# Patient Record
Sex: Female | Born: 1960 | Race: White | Hispanic: No | Marital: Married | Smoking: Former smoker
Health system: Southern US, Community
[De-identification: ages and names within clinical notes are randomized; demographics above are authoritative.]

## PROBLEM LIST (undated history)

## (undated) DIAGNOSIS — N200 Calculus of kidney: Secondary | ICD-10-CM

## (undated) DIAGNOSIS — E7800 Pure hypercholesterolemia, unspecified: Secondary | ICD-10-CM

## (undated) DIAGNOSIS — M5030 Other cervical disc degeneration, unspecified cervical region: Secondary | ICD-10-CM

## (undated) DIAGNOSIS — I2510 Atherosclerotic heart disease of native coronary artery without angina pectoris: Secondary | ICD-10-CM

## (undated) DIAGNOSIS — I219 Acute myocardial infarction, unspecified: Secondary | ICD-10-CM

## (undated) DIAGNOSIS — F419 Anxiety disorder, unspecified: Secondary | ICD-10-CM

## (undated) DIAGNOSIS — K219 Gastro-esophageal reflux disease without esophagitis: Secondary | ICD-10-CM

## (undated) DIAGNOSIS — Q438 Other specified congenital malformations of intestine: Secondary | ICD-10-CM

## (undated) DIAGNOSIS — K5790 Diverticulosis of intestine, part unspecified, without perforation or abscess without bleeding: Secondary | ICD-10-CM

## (undated) DIAGNOSIS — K449 Diaphragmatic hernia without obstruction or gangrene: Secondary | ICD-10-CM

## (undated) DIAGNOSIS — I10 Essential (primary) hypertension: Secondary | ICD-10-CM

## (undated) DIAGNOSIS — Z8619 Personal history of other infectious and parasitic diseases: Secondary | ICD-10-CM

## (undated) HISTORY — DX: Other specified congenital malformations of intestine: Q43.8

## (undated) HISTORY — DX: Atherosclerotic heart disease of native coronary artery without angina pectoris: I25.10

## (undated) HISTORY — PX: RETINAL DETACHMENT SURGERY: SHX105

## (undated) HISTORY — DX: Anxiety disorder, unspecified: F41.9

## (undated) HISTORY — DX: Diverticulosis of intestine, part unspecified, without perforation or abscess without bleeding: K57.90

## (undated) HISTORY — DX: Personal history of other infectious and parasitic diseases: Z86.19

## (undated) HISTORY — PX: COLONOSCOPY: SHX174

## (undated) HISTORY — PX: ESOPHAGOGASTRODUODENOSCOPY: SHX1529

## (undated) HISTORY — PX: ABDOMINAL HYSTERECTOMY: SHX81

## (undated) HISTORY — DX: Other cervical disc degeneration, unspecified cervical region: M50.30

## (undated) HISTORY — DX: Diaphragmatic hernia without obstruction or gangrene: K44.9

## (undated) HISTORY — PX: CHOLECYSTECTOMY: SHX55

## (undated) HISTORY — PX: OTHER SURGICAL HISTORY: SHX169

## (undated) HISTORY — DX: Calculus of kidney: N20.0

## (undated) HISTORY — DX: Acute myocardial infarction, unspecified: I21.9

---

## 2005-05-20 DIAGNOSIS — K2270 Barrett's esophagus without dysplasia: Secondary | ICD-10-CM

## 2005-05-20 HISTORY — DX: Barrett's esophagus without dysplasia: K22.70

## 2009-11-04 ENCOUNTER — Emergency Department (HOSPITAL_COMMUNITY): Admission: EM | Admit: 2009-11-04 | Discharge: 2009-11-04 | Payer: Self-pay | Admitting: Emergency Medicine

## 2018-07-05 ENCOUNTER — Encounter (HOSPITAL_COMMUNITY): Payer: Self-pay | Admitting: Emergency Medicine

## 2018-07-05 ENCOUNTER — Emergency Department (HOSPITAL_COMMUNITY)
Admission: EM | Admit: 2018-07-05 | Discharge: 2018-07-05 | Disposition: A | Discharge status: Home / Self Care | Payer: Federal, State, Local not specified - PPO | Attending: Emergency Medicine | Admitting: Emergency Medicine

## 2018-07-05 ENCOUNTER — Other Ambulatory Visit: Payer: Self-pay

## 2018-07-05 DIAGNOSIS — N390 Urinary tract infection, site not specified: Secondary | ICD-10-CM

## 2018-07-05 DIAGNOSIS — R300 Dysuria: Secondary | ICD-10-CM | POA: Diagnosis present

## 2018-07-05 DIAGNOSIS — I10 Essential (primary) hypertension: Secondary | ICD-10-CM | POA: Diagnosis not present

## 2018-07-05 HISTORY — DX: Essential (primary) hypertension: I10

## 2018-07-05 HISTORY — DX: Gastro-esophageal reflux disease without esophagitis: K21.9

## 2018-07-05 HISTORY — DX: Pure hypercholesterolemia, unspecified: E78.00

## 2018-07-05 LAB — CBC
HCT: 41.4 % (ref 36.0–46.0)
Hemoglobin: 13.6 g/dL (ref 12.0–15.0)
MCH: 28.6 pg (ref 26.0–34.0)
MCHC: 32.9 g/dL (ref 30.0–36.0)
MCV: 87 fL (ref 80.0–100.0)
Platelets: 277 10*3/uL (ref 150–400)
RBC: 4.76 MIL/uL (ref 3.87–5.11)
RDW: 13.2 % (ref 11.5–15.5)
WBC: 11.9 10*3/uL — ABNORMAL HIGH (ref 4.0–10.5)
nRBC: 0 % (ref 0.0–0.2)

## 2018-07-05 LAB — BASIC METABOLIC PANEL
Anion gap: 10 (ref 5–15)
BUN: 12 mg/dL (ref 6–20)
CO2: 22 mmol/L (ref 22–32)
Calcium: 9.3 mg/dL (ref 8.9–10.3)
Chloride: 102 mmol/L (ref 98–111)
Creatinine, Ser: 0.86 mg/dL (ref 0.44–1.00)
GFR calc Af Amer: >60 mL/min (ref >60)
GFR calc non Af Amer: >60 mL/min (ref >60)
Glucose, Bld: 101 mg/dL — ABNORMAL HIGH (ref 70–99)
Potassium: 3.5 mmol/L (ref 3.5–5.1)
Sodium: 134 mmol/L — ABNORMAL LOW (ref 135–145)

## 2018-07-05 LAB — URINALYSIS, ROUTINE W REFLEX MICROSCOPIC
Bilirubin Urine: NEGATIVE
GLUCOSE, UA: NEGATIVE mg/dL
Ketones, ur: NEGATIVE mg/dL
NITRITE: POSITIVE — AB
Protein, ur: NEGATIVE mg/dL
SPECIFIC GRAVITY, URINE: 1.011 (ref 1.005–1.030)
WBC, UA: >50 WBC/hpf — ABNORMAL HIGH (ref 0–5)
pH: 7 (ref 5.0–8.0)

## 2018-07-05 MED ORDER — ONDANSETRON HCL 4 MG PO TABS
4.0000 mg | ORAL_TABLET | Freq: Once | ORAL | Status: AC
  Administered 2018-07-05: 4 mg via ORAL
  Filled 2018-07-05: qty 1

## 2018-07-05 MED ORDER — CEPHALEXIN 500 MG PO CAPS: 500.0000 mg | ORAL_CAPSULE | Freq: Four times a day (QID) | ORAL | 0 refills | Status: DC

## 2018-07-05 MED ORDER — PHENAZOPYRIDINE HCL 100 MG PO TABS
100.0000 mg | ORAL_TABLET | Freq: Once | ORAL | Status: AC
  Administered 2018-07-05: 100 mg via ORAL
  Filled 2018-07-05: qty 1

## 2018-07-05 MED ORDER — FLUCONAZOLE 150 MG PO TABS: 150.0000 mg | ORAL_TABLET | Freq: Every day | ORAL | 1 refills | Status: DC

## 2018-07-05 MED ORDER — CEPHALEXIN 500 MG PO CAPS
500.0000 mg | ORAL_CAPSULE | Freq: Once | ORAL | Status: AC
  Administered 2018-07-05: 500 mg via ORAL
  Filled 2018-07-05: qty 1

## 2018-07-05 NOTE — Discharge Instructions (Addendum)
Your examination is consistent with a urinary tract infection.  Your urinalysis also confirms a urinary tract infection.  A culture has been sent to the lab to confirm that the antibiotics being used will be effective.  Your kidney function is well within normal limits.  Please increase fluids.  You may continue the Azo/Pyridium.  Use Tylenol every 4 hours for fever, or soreness.  Please see your primary physician or return to the emergency department if any changes in your condition, or signs of advancing infection.

## 2018-07-05 NOTE — ED Provider Notes (Signed)
[facility] EMERGENCY DEPARTMENT Provider Note   CSN: [id number] Arrival date & time: 07/05/18  1736     History   Chief Complaint Chief Complaint  Patient presents with  . Urinary Tract Infection    HPI Lauren Mcneil is a 58 y.o. female.  The history is provided by the patient.  Urinary Tract Infection  Pain quality:  Burning Pain severity:  Moderate Onset quality:  Gradual Duration:  1 week Timing:  Intermittent Progression:  Worsening Chronicity:  New Relieved by:  Nothing Worsened by:  Nothing Ineffective treatments:  Phenazopyridine Urinary symptoms: frequent urination   Urinary symptoms: no hematuria   Associated symptoms: fever   Associated symptoms: no abdominal pain, no nausea and no vomiting   Associated symptoms comment:  Pelvic pain and pressure Back pain   Past Medical History:  Diagnosis Date  . GERD (gastroesophageal reflux disease)   . Hypercholesteremia   . Hypertension     There are no active problems to display for this patient.   Past Surgical History:  Procedure Laterality Date  . ABDOMINAL HYSTERECTOMY    . VAGINAL DELIVERY       OB History   No obstetric history on file.      Home Medications    Prior to Admission medications   Not on File    Family History No family history on file.  Social History Social History   Tobacco Use  . Smoking status: Never Smoker  . Smokeless tobacco: Never Used  Substance Use Topics  . Alcohol use: Never    Frequency: Never  . Drug use: Never     Allergies   Sulfa antibiotics   Review of Systems Review of Systems  Constitutional: Positive for fever. Negative for activity change.       All ROS Neg except as noted in HPI  HENT: Negative for nosebleeds.   Eyes: Negative for photophobia and discharge.  Respiratory: Negative for cough, shortness of breath and wheezing.   Cardiovascular: Negative for chest pain and palpitations.  Gastrointestinal: Negative for abdominal pain,  blood in stool, nausea and vomiting.  Genitourinary: Positive for dysuria and urgency. Negative for frequency and hematuria.  Musculoskeletal: Negative for arthralgias, back pain and neck pain.  Skin: Negative.   Neurological: Negative for dizziness, seizures and speech difficulty.  Psychiatric/Behavioral: Negative for confusion and hallucinations.     Physical Exam Updated Vital Signs BP (!) 152/88 (BP Location: Right Arm)   Pulse 100   Temp 99.6 F (37.6 C) (Oral)   Resp 18   Ht 5' (1.524 m)   Wt 74.8 kg   SpO2 98%   BMI 32.22 kg/m   Physical Exam Vitals signs and nursing note reviewed.  Constitutional:      Appearance: She is well-developed. She is not toxic-appearing.  HENT:     Head: Normocephalic.     Right Ear: Tympanic membrane and external ear normal.     Left Ear: Tympanic membrane and external ear normal.  Eyes:     General: Lids are normal.     Pupils: Pupils are equal, round, and reactive to light.  Neck:     Musculoskeletal: Normal range of motion and neck supple.     Vascular: No carotid bruit.  Cardiovascular:     Rate and Rhythm: Normal rate and regular rhythm.     Pulses: Normal pulses.     Heart sounds: Normal heart sounds.  Pulmonary:     Effort: No respiratory distress.  Breath sounds: Normal breath sounds.  Abdominal:     General: Bowel sounds are normal.     Palpations: Abdomen is soft.     Tenderness: There is abdominal tenderness in the suprapubic area. There is left CVA tenderness. There is no guarding.  Musculoskeletal: Normal range of motion.  Lymphadenopathy:     Head:     Right side of head: No submandibular adenopathy.     Left side of head: No submandibular adenopathy.     Cervical: No cervical adenopathy.  Skin:    General: Skin is warm and dry.  Neurological:     Mental Status: She is alert and oriented to person, place, and time.     Cranial Nerves: No cranial nerve deficit.     Sensory: No sensory deficit.  Psychiatric:         Speech: Speech normal.      ED Treatments / Results  Labs (all labs ordered are listed, but only abnormal results are displayed) Labs Reviewed  URINE CULTURE  URINALYSIS, ROUTINE W REFLEX MICROSCOPIC  BASIC METABOLIC PANEL  CBC    EKG None  Radiology No results found.  Procedures Procedures (including critical care time)  Medications Ordered in ED Medications - No data to display   Initial Impression / Assessment and Plan / ED Course  I have reviewed the triage vital signs and the nursing notes.  Pertinent labs & imaging results that were available during my care of the patient were reviewed by me and considered in my medical decision making (see chart for details).       Final Clinical Impressions(s) / ED Diagnoses MDM  Vital signs reviewed.  Pulse oximetry is 98% on room air.  Within normal limits by my interpretation.  The urine analysis shows positive nitrates, as well as moderate leukocyte esterase.  There is greater than 50 white blood cells and many bacteria present. Basic metabolic panel is within normal limits.  Complete blood count shows the white blood cells to be slightly elevated at 11.9, there is no shift to the left.  A culture of the urine has been sent to the lab.  Patient will be treated with Keflex.  Patient states she is also using Azo to help with discomfort.  I have asked her to use Tylenol every 4 hours if needed for temperature elevation, and/or aching.  The urine analysis shows yeast present.  The patient is also treated with Diflucan.  Patient will see the primary physician or return to the emergency department if any changes in condition, problems, or concerns.   Final diagnoses:  Lower urinary tract infectious disease    ED Discharge Orders         Ordered    cephALEXin (KEFLEX) 500 MG capsule  4 times daily     07/05/18 1944    fluconazole (DIFLUCAN) 150 MG tablet  Daily     07/05/18 1950        Emergency department  getting I do not know if I have been evacuated I do you certainly welcome to hold on just yesterday   Ivery Quale, Cordelia Poche 07/05/18 1958    Mesner, Barbara Cower, MD 07/05/18 2038

## 2018-07-05 NOTE — ED Triage Notes (Signed)
Pt c/o of burning with urination, frequency in urination, and pelvic pain and pressure

## 2018-07-08 LAB — URINE CULTURE: Culture: >=100000 — AB

## 2018-07-09 ENCOUNTER — Telehealth: Payer: Self-pay | Admitting: *Deleted

## 2018-07-09 ENCOUNTER — Telehealth: Payer: Self-pay

## 2018-07-09 ENCOUNTER — Telehealth (HOSPITAL_COMMUNITY): Payer: Self-pay | Admitting: Pharmacist

## 2018-07-09 NOTE — Telephone Encounter (Signed)
Post ED Visit - Positive Culture Follow-up: Successful Patient Follow-Up  Culture assessed and recommendations reviewed by:  []  Enzo Bi, Pharm.D. []  Celedonio Miyamoto, Pharm.D., BCPS AQ-ID []  Garvin Fila, Pharm.D., BCPS []  Georgina Pillion, Pharm.D., BCPS []  [place], [address].D., BCPS, AAHIVP []  Estella Husk, Pharm.D., BCPS, AAHIVP []  Lysle Pearl, PharmD, BCPS []  Phillips Climes, PharmD, BCPS [x]  Agapito Games, PharmD, BCPS []  Verlan Friends, PharmD  Positive urine culture  []  Patient discharged without antimicrobial prescription and treatment is now indicated [x]  Organism is resistant to prescribed ED discharge antimicrobial []  Patient with positive blood cultures  Changes discussed with ED provider Eyvonne Mechanic, PA New antibiotic prescription Nitrofurantoin 100mg  PO BID x 5 days Called to [facility], [place] [phone]  Contacted patient, date 07/09/2018, time 1513   Lysle Pearl 07/09/2018, 3:12 PM

## 2018-07-09 NOTE — Progress Notes (Signed)
ED Antimicrobial Stewardship Positive Culture Follow Up   Lauren Mcneil is an 58 y.o. female who presented to [facility] on (Not on file) with a chief complaint of No chief complaint on file.   Recent Results (from the past 720 hour(s))  Urine C&S     Status: Abnormal   Collection Time: 07/05/18  6:45 PM  Result Value Ref Range Status   Specimen Description   Final    URINE, CLEAN CATCH Performed at [facility], [address] [place], [place] [address]    Special Requests   Final    NONE Performed at [facility], [address], [place], [place] [address]    Culture >=100,000 COLONIES/mL ESCHERICHIA COLI (A)  Final   Report Status 07/08/2018 FINAL  Final   Organism ID, Bacteria ESCHERICHIA COLI (A)  Final      Susceptibility   Escherichia coli - MIC*    AMPICILLIN >=32 RESISTANT Resistant     CEFAZOLIN >=64 RESISTANT Resistant     CEFTRIAXONE <=1 SENSITIVE Sensitive     CIPROFLOXACIN <=0.25 SENSITIVE Sensitive     GENTAMICIN <=1 SENSITIVE Sensitive     IMIPENEM <=0.25 SENSITIVE Sensitive     NITROFURANTOIN 32 SENSITIVE Sensitive     TRIMETH/SULFA >=320 RESISTANT Resistant     AMPICILLIN/SULBACTAM >=32 RESISTANT Resistant     PIP/TAZO <=4 SENSITIVE Sensitive     Extended ESBL NEGATIVE Sensitive     * >=100,000 COLONIES/mL ESCHERICHIA COLI    [x]  Treated with Cephalexin, organism resistant to prescribed antimicrobial []  Patient discharged originally without antimicrobial agent and treatment is now indicated  New antibiotic prescription: Nitrofurantoin 100 mg po BID x 5 days  ED Provider: Mamie Laurel, PA-C   MastersDarl Householder 07/09/2018, 8:49 AM Clinical Pharmacist Monday - Friday phone -  [phone] Saturday - Sunday phone - [phone]

## 2018-07-09 NOTE — Telephone Encounter (Signed)
Post ED Visit - Positive Culture Follow-up: Unsuccessful Patient Follow-up  Culture assessed and recommendations reviewed by:  []  Enzo Bi, Pharm.D. []  Celedonio Miyamoto, [address].D., BCPS AQ-ID []  Garvin Fila, Pharm.D., BCPS []  Georgina Pillion, Pharm.D., BCPS []  [place], [address].D., BCPS, AAHIVP []  Estella Husk, Pharm.D., BCPS, AAHIVP []  Sherlynn Carbon, PharmD []  Pollyann Samples, PharmD, BCPS A Masters Pharm D Positive urine culture  []  Patient discharged without antimicrobial prescription and treatment is now indicated [x]  Organism is resistant to prescribed ED discharge antimicrobial []  Patient with positive blood cultures   Unable to contact patient after 3 attempts, letter will be sent to address on file  Jerry Caras 07/09/2018, 10:48 AM

## 2020-06-17 ENCOUNTER — Encounter (HOSPITAL_COMMUNITY): Payer: Self-pay

## 2020-06-17 ENCOUNTER — Emergency Department (HOSPITAL_COMMUNITY): Payer: Federal, State, Local not specified - PPO

## 2020-06-17 ENCOUNTER — Other Ambulatory Visit: Payer: Self-pay

## 2020-06-17 ENCOUNTER — Emergency Department (HOSPITAL_COMMUNITY)
Admission: EM | Admit: 2020-06-17 | Discharge: 2020-06-17 | Disposition: A | Discharge status: Home / Self Care | Payer: Federal, State, Local not specified - PPO | Attending: Emergency Medicine | Admitting: Emergency Medicine

## 2020-06-17 DIAGNOSIS — I10 Essential (primary) hypertension: Secondary | ICD-10-CM | POA: Insufficient documentation

## 2020-06-17 DIAGNOSIS — R0789 Other chest pain: Secondary | ICD-10-CM

## 2020-06-17 LAB — CBC
HCT: 40 % (ref 36.0–46.0)
Hemoglobin: 13.4 g/dL (ref 12.0–15.0)
MCH: 29.3 pg (ref 26.0–34.0)
MCHC: 33.5 g/dL (ref 30.0–36.0)
MCV: 87.5 fL (ref 80.0–100.0)
Platelets: 239 10*3/uL (ref 150–400)
RBC: 4.57 MIL/uL (ref 3.87–5.11)
RDW: 13.4 % (ref 11.5–15.5)
WBC: 7.3 10*3/uL (ref 4.0–10.5)
nRBC: 0 % (ref 0.0–0.2)

## 2020-06-17 LAB — BASIC METABOLIC PANEL
Anion gap: 7 (ref 5–15)
BUN: 19 mg/dL (ref 6–20)
CO2: 23 mmol/L (ref 22–32)
Calcium: 9.1 mg/dL (ref 8.9–10.3)
Chloride: 102 mmol/L (ref 98–111)
Creatinine, Ser: 0.71 mg/dL (ref 0.44–1.00)
GFR, Estimated: >60 mL/min (ref >60)
Glucose, Bld: 101 mg/dL — ABNORMAL HIGH (ref 70–99)
Potassium: 3.9 mmol/L (ref 3.5–5.1)
Sodium: 132 mmol/L — ABNORMAL LOW (ref 135–145)

## 2020-06-17 LAB — DIFFERENTIAL
Abs Immature Granulocytes: 0.04 10*3/uL (ref 0.00–0.07)
Basophils Absolute: 0.1 10*3/uL (ref 0.0–0.1)
Basophils Relative: 1 %
Eosinophils Absolute: 0.3 10*3/uL (ref 0.0–0.5)
Eosinophils Relative: 4 %
Immature Granulocytes: 1 %
Lymphocytes Relative: 37 %
Lymphs Abs: 2.7 10*3/uL (ref 0.7–4.0)
Monocytes Absolute: 0.9 10*3/uL (ref 0.1–1.0)
Monocytes Relative: 13 %
Neutro Abs: 3.3 10*3/uL (ref 1.7–7.7)
Neutrophils Relative %: 44 %

## 2020-06-17 LAB — HEPATIC FUNCTION PANEL
ALT: 21 U/L (ref 0–44)
AST: 21 U/L (ref 15–41)
Albumin: 4 g/dL (ref 3.5–5.0)
Alkaline Phosphatase: 52 U/L (ref 38–126)
Bilirubin, Direct: 0.1 mg/dL (ref 0.0–0.2)
Indirect Bilirubin: 0.4 mg/dL (ref 0.3–0.9)
Total Bilirubin: 0.5 mg/dL (ref 0.3–1.2)
Total Protein: 6.8 g/dL (ref 6.5–8.1)

## 2020-06-17 LAB — TROPONIN I (HIGH SENSITIVITY)
Troponin I (High Sensitivity): 11 ng/L (ref <18)
Troponin I (High Sensitivity): 16 ng/L (ref <18)

## 2020-06-17 MED ORDER — NITROGLYCERIN 0.4 MG SL SUBL: 0.4000 mg | SUBLINGUAL_TABLET | SUBLINGUAL | 0 refills | Status: DC | PRN

## 2020-06-17 NOTE — ED Triage Notes (Signed)
Pt arrives from home via POV c/o CP that has been on-going allday today. Pt reports taking 2 ASA 325 mg @ apprx 1600 today without relief from symptoms. Pt reports left side heaviness traveling down left arm. Denies SOB, N/V, weakness and HA.

## 2020-06-17 NOTE — ED Provider Notes (Signed)
[facility] EMERGENCY DEPARTMENT Provider Note   CSN: [id number] Arrival date & time: 06/17/20  1857     History Chief Complaint  Patient presents with  . Chest Pain    Lauren Mcneil is a 60 y.o. female.  Patient states she has been having some chest discomfort off and on for the last couple weeks.  The pain is not related to exertion.  She is not having any pain right now  The history is provided by the patient and medical records.  Chest Pain Pain location:  L chest Pain quality: aching   Pain radiates to:  L shoulder Pain severity:  Mild Onset quality:  Sudden Timing:  Intermittent Progression:  Resolved Chronicity:  New Context: not breathing   Relieved by:  Nothing Associated symptoms: no abdominal pain, no back pain, no cough, no fatigue and no headache        Past Medical History:  Diagnosis Date  . GERD (gastroesophageal reflux disease)   . Hypercholesteremia   . Hypertension     There are no problems to display for this patient.   Past Surgical History:  Procedure Laterality Date  . ABDOMINAL HYSTERECTOMY    . VAGINAL DELIVERY       OB History    Gravida      Para      Term      Preterm      AB      Living  3     SAB      IAB      Ectopic      Multiple      Live Births              History reviewed. No pertinent family history.  Social History   Tobacco Use  . Smoking status: Never Smoker  . Smokeless tobacco: Never Used  Vaping Use  . Vaping Use: Never used  Substance Use Topics  . Alcohol use: Never  . Drug use: Never    Home Medications Prior to Admission medications   Medication Sig Start Date End Date Taking? Authorizing Provider  nitroGLYCERIN (NITROSTAT) 0.4 MG SL tablet Place 1 tablet (0.4 mg total) under the tongue every 5 (five) minutes as needed for chest pain. 06/17/20  Yes Bethann Berkshire, MD  cephALEXin (KEFLEX) 500 MG capsule Take 1 capsule (500 mg total) by mouth 4 (four) times daily. 07/05/18    Ivery Quale, PA-C  fluconazole (DIFLUCAN) 150 MG tablet Take 1 tablet (150 mg total) by mouth daily. 07/05/18   Ivery Quale, PA-C    Allergies    Sulfa antibiotics and Chlorhexidine  Review of Systems   Review of Systems  Constitutional: Negative for appetite change and fatigue.  HENT: Negative for congestion, ear discharge and sinus pressure.   Eyes: Negative for discharge.  Respiratory: Negative for cough.   Cardiovascular: Positive for chest pain.  Gastrointestinal: Negative for abdominal pain and diarrhea.  Genitourinary: Negative for frequency and hematuria.  Musculoskeletal: Negative for back pain.  Skin: Negative for rash.  Neurological: Negative for seizures and headaches.  Psychiatric/Behavioral: Negative for hallucinations.    Physical Exam Updated Vital Signs BP 130/75   Pulse (!) 58   Temp 97.8 F (36.6 C) (Oral)   Resp 20   Ht 5' (1.524 m)   Wt 72.6 kg   SpO2 100%   BMI 31.25 kg/m   Physical Exam Vitals and nursing note reviewed.  Constitutional:      Appearance: She is  well-developed.  HENT:     Head: Normocephalic.     Mouth/Throat:     Mouth: Mucous membranes are moist.  Eyes:     General: No scleral icterus.    Extraocular Movements: EOM normal.     Conjunctiva/sclera: Conjunctivae normal.  Neck:     Thyroid: No thyromegaly.  Cardiovascular:     Rate and Rhythm: Normal rate and regular rhythm.     Heart sounds: No murmur heard. No friction rub. No gallop.   Pulmonary:     Breath sounds: No stridor. No wheezing or rales.  Chest:     Chest wall: No tenderness.  Abdominal:     General: There is no distension.     Tenderness: There is no abdominal tenderness. There is no rebound.  Musculoskeletal:        General: No edema. Normal range of motion.     Cervical back: Neck supple.  Lymphadenopathy:     Cervical: No cervical adenopathy.  Skin:    Findings: No erythema or rash.  Neurological:     Mental Status: She is alert and oriented  to person, place, and time.     Motor: No abnormal muscle tone.     Coordination: Coordination normal.  Psychiatric:        Mood and Affect: Mood and affect normal.        Behavior: Behavior normal.     ED Results / Procedures / Treatments   Labs (all labs ordered are listed, but only abnormal results are displayed) Labs Reviewed  BASIC METABOLIC PANEL - Abnormal; Notable for the following components:      Result Value   Sodium 132 (*)    Glucose, Bld 101 (*)    All other components within normal limits  CBC  HEPATIC FUNCTION PANEL  DIFFERENTIAL  TROPONIN I (HIGH SENSITIVITY)  TROPONIN I (HIGH SENSITIVITY)    EKG EKG Interpretation  Date/Time:  Saturday June 17 2020 19:24:30 EST Ventricular Rate:  73 PR Interval:  172 QRS Duration: 84 QT Interval:  376 QTC Calculation: 414 R Axis:   -10 Text Interpretation: Normal sinus rhythm Low voltage QRS Inferior infarct , age undetermined Abnormal ECG Confirmed by Bethann Berkshire [id number]) on 06/17/2020 8:28:19 PM   Radiology DG Chest 2 View  Result Date: 06/17/2020 CLINICAL DATA:  Chest pain EXAM: CHEST - 2 VIEW COMPARISON:  None. FINDINGS: The heart size and mediastinal contours are within normal limits. Both lungs are clear. The visualized skeletal structures are unremarkable. IMPRESSION: No active cardiopulmonary disease. Electronically Signed   By: Alcide Clever M.D.   On: 06/17/2020 20:21    Procedures Procedures   Medications Ordered in ED Medications - No data to display  ED Course  I have reviewed the triage vital signs and the nursing notes.  Pertinent labs & imaging results that were available during my care of the patient were reviewed by me and considered in my medical decision making (see chart for details).    MDM Rules/Calculators/A&P                          Patient with atypical chest pain.  Patient had 2 troponins that were normal chest x-ray and EKG unremarkable.  Patient told to take an aspirin a day  and is given a prescription of nitroglycerin.  She has been referred to cardiology to be seen this week for possible further work-up.  She will return if any problem Final Clinical  Impression(s) / ED Diagnoses Final diagnoses:  Atypical chest pain    Rx / DC Orders ED Discharge Orders         Ordered    nitroGLYCERIN (NITROSTAT) 0.4 MG SL tablet  Every 5 min PRN        06/17/20 2246           Bethann Berkshire, MD 06/17/20 2251

## 2020-06-17 NOTE — Discharge Instructions (Signed)
Take 1 aspirin a day.  If he had the chest pain take the nitroglycerin if you get better you can follow-up in the office.  If your pain does not improve you need to come back to the hospital.  You have been referred to Dr. Wyline Mood and you can call his office Monday to get seen this week by him or any of his associates, return if any problem

## 2020-06-19 NOTE — H&P (View-Only) (Signed)
Cardiology Office Note:    Date:  06/20/2020   ID:  Lauren Mcneil, DOB 1960/05/25, MRN [id number]  PCP:  Gardiner Rhyme, MD  Cardiologist:  No primary care provider on file.   Referring MD: Gardiner Rhyme, MD   Chief Complaint  Patient presents with  . Coronary Artery Disease  . Chest Pain    History of Present Illness:    TEEA Lauren Mcneil is a 60 y.o. female with a hx of  Recent [facility] emergency room visit for chest pain and referred for evaluation. RF include hyperlipidemia, primary hypertension  Emergency room history "Patient states she has been having some chest discomfort off and on for the last couple weeks.  The pain is not related to exertion.  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  Over the past week she has had several sudden episodes of chest discomfort in the left parasternal area that radiates into the arm.  These episodes last seconds before resolving.  She also had one episode of a different type discomfort that occurred while walking into the grocery store.  It was left parasternal with some sensation of pressure.  It lasted greater than an hour.  She went to the emergency room at [facility] where hs Trop I and ECG was done.  These were negative.  She is here today for cardiac assessment.  The patient has a history of untreated hyperlipidemia, hypertension, (takes lisinopril HCTZ 10/12.5 mg/day), father had myocardial infarction and died at age 35.  She smoked until 20 years ago.  Never smoked more than 2 cigarettes/day.  She is not diabetic.  She sleeps well.  She takes her blood pressure medicine each night before going to work.  She does not monitor her blood pressure at home.  Past Medical History:  Diagnosis Date  . GERD (gastroesophageal reflux disease)   . Hypercholesteremia   . Hypertension     Past Surgical History:   Procedure Laterality Date  . ABDOMINAL HYSTERECTOMY    . VAGINAL DELIVERY      Current Medications: Current Meds  Medication Sig  . lisinopril-hydrochlorothiazide (ZESTORETIC) 10-12.5 MG tablet Take 1 tablet by mouth daily.  . metoprolol succinate (TOPROL XL) 25 MG 24 hr tablet Take 1 tablet (25 mg total) by mouth daily.  . nitroGLYCERIN (NITROSTAT) 0.4 MG SL tablet Place 1 tablet (0.4 mg total) under the tongue every 5 (five) minutes as needed for chest pain.  Marland Kitchen omeprazole-sodium bicarbonate (ZEGERID) 40-1100 MG capsule Take 1 capsule by mouth daily.  . rosuvastatin (CRESTOR) 40 MG tablet Take 1 tablet (40 mg total) by mouth daily.     Allergies:   Sulfa antibiotics and Chlorhexidine   Social History   Socioeconomic History  . Marital status: Married    Spouse name: Not on file  . Number of children: Not on file  . Years of education: Not on file  . Highest education level: Not on file  Occupational History  . Not on file  Tobacco Use  . Smoking status: Former Smoker    Types: Cigarettes    Quit date: 2003    Years since quitting: 19.0  . Smokeless tobacco: Never Used  Vaping Use  . Vaping Use: Never used  Substance and Sexual Activity  . Alcohol use: Never  . Drug use: Never  . Sexual activity:  Yes  Other Topics Concern  . Not on file  Social History Narrative  . Not on file   Social Determinants of Health   Financial Resource Strain: Not on file  Food Insecurity: Not on file  Transportation Needs: Not on file  Physical Activity: Not on file  Stress: Not on file  Social Connections: Not on file     Family History: The patient's family history includes Diabetes in her mother; Heart attack in her father.  ROS:   Please see the history of present illness.    No claudication.  No neurological complaints.  She has reflux that is treated with Zegerid successfully.  All other systems reviewed and are negative.  EKGs/Labs/Other Studies Reviewed:    The  following studies were reviewed today: No cardiac imaging  EKG:  EKG  NSR with low voltage 06/17/2020.  Recent Labs: 06/17/2020: ALT 21; BUN 19; Creatinine, Ser 0.71; Hemoglobin 13.4; Platelets 239; Potassium 3.9; Sodium 132  Recent Lipid Panel No results found for: CHOL, TRIG, HDL, CHOLHDL, VLDL, LDLCALC, LDLDIRECT  Physical Exam:    VS:  BP (!) 142/88   Pulse 64   Ht 5' (1.524 m)   Wt 161 lb 3.2 oz (73.1 kg)   SpO2 97%   BMI 31.48 kg/m     Wt Readings from Last 3 Encounters:  06/20/20 161 lb 3.2 oz (73.1 kg)  06/17/20 160 lb (72.6 kg)  07/05/18 165 lb (74.8 kg)     GEN: Obese. No acute distress HEENT: Normal NECK: No JVD. LYMPHATICS: No lymphadenopathy CARDIAC: No murmur. RRR no gallop, or edema. VASCULAR:  Normal Pulses. No bruits. RESPIRATORY:  Clear to auscultation without rales, wheezing or rhonchi  ABDOMEN: Soft, non-tender, non-distended, No pulsatile mass, MUSCULOSKELETAL: No deformity  SKIN: Warm and dry NEUROLOGIC:  Alert and oriented x 3 PSYCHIATRIC:  Normal affect   ASSESSMENT:    1. Chest discomfort   2. Primary hypertension   3. Hyperlipidemia LDL goal <70   4. Educated about COVID-19 virus infection   5. Precordial pain    PLAN:    In order of problems listed above:  1. She has significant risk factors.  Symptoms atypical.  Coronary CTA with FFR if indicated.  Start aspirin 81 mg/day.  Start Toprol-XL 25 mg/day.  Start Crestor 40 mg/day.  Use sublingual nitroglycerin if prolonged chest discomfort. 2. Will titrate blood pressure medication to achieve target of 130/80 mmHg.  Low-salt diet. 3. Rosuvastatin 40 mg/day.  December 2021 LDL was 188. 4. Vaccinated and practicing medication.  Practicing medication.  Overall education and awareness concerning primary risk prevention was discussed in detail: LDL less than 70, hemoglobin A1c less than 7, blood pressure target less than 130/80 mmHg, >150 minutes of moderate aerobic activity per week, avoidance  of smoking, weight control (via diet and exercise), and continued surveillance/management of/for obstructive sleep apnea.    Medication Adjustments/Labs and Tests Ordered: Current medicines are reviewed at length with the patient today.  Concerns regarding medicines are outlined above.  Orders Placed This Encounter  Procedures  . CT CORONARY MORPH W/CTA COR W/SCORE W/CA W/CM &/OR WO/CM  . CT CORONARY FRACTIONAL FLOW RESERVE DATA PREP  . CT CORONARY FRACTIONAL FLOW RESERVE FLUID ANALYSIS  . Basic metabolic panel   Meds ordered this encounter  Medications  . rosuvastatin (CRESTOR) 40 MG tablet    Sig: Take 1 tablet (40 mg total) by mouth daily.    Dispense:  90 tablet    Refill:  3  .  metoprolol succinate (TOPROL XL) 25 MG 24 hr tablet    Sig: Take 1 tablet (25 mg total) by mouth daily.    Dispense:  90 tablet    Refill:  3    Patient Instructions  Medication Instructions:  1) START Rosuvastatin 40m once daily 2) START Metoprolol Succinate 281monce daily  *If you need a refill on your cardiac medications before your next appointment, please call your pharmacy*   Lab Work: BMET today  If you have labs (blood work) drawn today and your tests are completely normal, you will receive your results only by: . Marland KitchenyChart Message (if you have MyChart) OR . A paper copy in the mail If you have any lab test that is abnormal or we need to change your treatment, we will call you to review the results.   Testing/Procedures: Your physician recommends that you have a Coronary CT performed.   Follow-Up: [facility].  As part of our continuing mission to provide you with exceptional heart care, we have created designated Provider Care Teams.  These Care Teams include your primary Cardiologist (physician) and Advanced Practice Providers (APPs -  Physician Assistants and Nurse Practitioners) who all work together to provide you with the care  you need, when you need it.  We recommend signing up for the patient portal called "MyChart".  Sign up information is provided on this After Visit Summary.  MyChart is used to connect with patients for Virtual Visits (Telemedicine).  Patients are able to view lab/test results, encounter notes, upcoming appointments, etc.  Non-urgent messages can be sent to your provider as well.   To learn more about what you can do with MyChart, go to [email or web address]   Your next appointment:   1 month(s)  The format for your next appointment:   In Person  Provider:   You may see Dr. HeDaneen Schickr one of the following Advanced Practice Providers on your designated Care Team:    JiKathyrn DrownNP    Other Instructions  Your cardiac CT will be scheduled at one of the below locations:   [phone] [phone] scheduled at [facility] at the [facility] entrance of [facility]0 minutes prior to test start time. Proceed to the [facility] (first floor) to check-in and test prep.  [facility] scheduled at [facility] 15 mins early for check-in and test prep.  Please follow these instructions carefully (unless otherwise directed):  On the Night Before the Test: . Be sure to Drink plenty of water. . Do not consume any caffeinated/decaffeinated beverages or chocolate 12 hours prior to your test. . Do not take any antihistamines 12 hours prior to your test.   On the Day of the Test: . Drink plenty of water. Do not drink any water within one hour of the test. . Do not eat any food 4 hours prior to the test. . You may take your regular medications prior to the test.  . Take metoprolol (Lopressor) two hours prior to  test. . HOLD Furosemide/Hydrochlorothiazide morning of the test. . FEMALES- please wear underwire-free bra if available       After the Test: . Drink plenty of water. . After receiving  IV contrast, you may experience a mild flushed feeling. This is normal. . On occasion, you may experience a mild rash up to 24 hours after the test. This is not dangerous. If this occurs, you can take Benadryl 25 mg and increase your fluid intake. . If you experience trouble breathing, this can be serious. If it is severe call 911 IMMEDIATELY. If it is mild, please call our office. . If you take any of these medications: Glipizide/Metformin, Avandament, Glucavance, please do not take 48 hours after completing test unless otherwise instructed.   Once we have confirmed authorization from your insurance company, we will call you to set up a date and time for your test. Based on how quickly your insurance processes prior authorizations requests, please allow up to 4 weeks to be contacted for scheduling your Cardiac CT appointment. Be advised that routine Cardiac CT appointments could be scheduled as many as 8 weeks after your provider has ordered it.  For non-scheduling related questions, please contact the cardiac imaging nurse navigator should you have any questions/concerns: Marchia Bond, Cardiac Imaging Nurse Navigator Burley Saver, Interim Cardiac Imaging Nurse [place] [facility] Direct Office Dial: [phone]   For scheduling needs, including cancellations and rescheduling, please call [place], [phone].       Signed, Sinclair Grooms, MD  06/20/2020 10:23 AM    [facility]   Addendum: The coronary CTA with FFR is abnormal and suggests severe RCA disease. 06/27/2020: FINDINGS: FFR CT positive in RCA: Abnormality begins in the mid RCA 0.76 and extends through out RCA falling to 0.50 in distal RCA and PDA  LAD is 0.86 in the mid vessel and  falls to 0.72 in the distal vessel  IMPRESSION: Positive FFR CT for hemodynamically significant lesion in mid/distal RCA  Bertram Savin, III, MD  07/09/2020

## 2020-06-19 NOTE — Progress Notes (Addendum)
Cardiology Office Note:    Date:  06/20/2020   ID:  Lauren Mcneil, DOB 1960/05/25, MRN [id number]  PCP:  Gardiner Rhyme, MD  Cardiologist:  No primary care provider on file.   Referring MD: Gardiner Rhyme, MD   Chief Complaint  Patient presents with  . Coronary Artery Disease  . Chest Pain    History of Present Illness:    Lauren Mcneil is a 60 y.o. female with a hx of  Recent [facility] emergency room visit for chest pain and referred for evaluation. RF include hyperlipidemia, primary hypertension  Emergency room history "Patient states she has been having some chest discomfort off and on for the last couple weeks.  The pain is not related to exertion.  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  Over the past week she has had several sudden episodes of chest discomfort in the left parasternal area that radiates into the arm.  These episodes last seconds before resolving.  She also had one episode of a different type discomfort that occurred while walking into the grocery store.  It was left parasternal with some sensation of pressure.  It lasted greater than an hour.  She went to the emergency room at [facility] where hs Trop I and ECG was done.  These were negative.  She is here today for cardiac assessment.  The patient has a history of untreated hyperlipidemia, hypertension, (takes lisinopril HCTZ 10/12.5 mg/day), father had myocardial infarction and died at age 35.  She smoked until 20 years ago.  Never smoked more than 2 cigarettes/day.  She is not diabetic.  She sleeps well.  She takes her blood pressure medicine each night before going to work.  She does not monitor her blood pressure at home.  Past Medical History:  Diagnosis Date  . GERD (gastroesophageal reflux disease)   . Hypercholesteremia   . Hypertension     Past Surgical History:   Procedure Laterality Date  . ABDOMINAL HYSTERECTOMY    . VAGINAL DELIVERY      Current Medications: Current Meds  Medication Sig  . lisinopril-hydrochlorothiazide (ZESTORETIC) 10-12.5 MG tablet Take 1 tablet by mouth daily.  . metoprolol succinate (TOPROL XL) 25 MG 24 hr tablet Take 1 tablet (25 mg total) by mouth daily.  . nitroGLYCERIN (NITROSTAT) 0.4 MG SL tablet Place 1 tablet (0.4 mg total) under the tongue every 5 (five) minutes as needed for chest pain.  Marland Kitchen omeprazole-sodium bicarbonate (ZEGERID) 40-1100 MG capsule Take 1 capsule by mouth daily.  . rosuvastatin (CRESTOR) 40 MG tablet Take 1 tablet (40 mg total) by mouth daily.     Allergies:   Sulfa antibiotics and Chlorhexidine   Social History   Socioeconomic History  . Marital status: Married    Spouse name: Not on file  . Number of children: Not on file  . Years of education: Not on file  . Highest education level: Not on file  Occupational History  . Not on file  Tobacco Use  . Smoking status: Former Smoker    Types: Cigarettes    Quit date: 2003    Years since quitting: 19.0  . Smokeless tobacco: Never Used  Vaping Use  . Vaping Use: Never used  Substance and Sexual Activity  . Alcohol use: Never  . Drug use: Never  . Sexual activity:  Yes  Other Topics Concern  . Not on file  Social History Narrative  . Not on file   Social Determinants of Health   Financial Resource Strain: Not on file  Food Insecurity: Not on file  Transportation Needs: Not on file  Physical Activity: Not on file  Stress: Not on file  Social Connections: Not on file     Family History: The patient's family history includes Diabetes in her mother; Heart attack in her father.  ROS:   Please see the history of present illness.    No claudication.  No neurological complaints.  She has reflux that is treated with Zegerid successfully.  All other systems reviewed and are negative.  EKGs/Labs/Other Studies Reviewed:    The  following studies were reviewed today: No cardiac imaging  EKG:  EKG  NSR with low voltage 06/17/2020.  Recent Labs: 06/17/2020: ALT 21; BUN 19; Creatinine, Ser 0.71; Hemoglobin 13.4; Platelets 239; Potassium 3.9; Sodium 132  Recent Lipid Panel No results found for: CHOL, TRIG, HDL, CHOLHDL, VLDL, LDLCALC, LDLDIRECT  Physical Exam:    VS:  BP (!) 142/88   Pulse 64   Ht 5' (1.524 m)   Wt 161 lb 3.2 oz (73.1 kg)   SpO2 97%   BMI 31.48 kg/m     Wt Readings from Last 3 Encounters:  06/20/20 161 lb 3.2 oz (73.1 kg)  06/17/20 160 lb (72.6 kg)  07/05/18 165 lb (74.8 kg)     GEN: Obese. No acute distress HEENT: Normal NECK: No JVD. LYMPHATICS: No lymphadenopathy CARDIAC: No murmur. RRR no gallop, or edema. VASCULAR:  Normal Pulses. No bruits. RESPIRATORY:  Clear to auscultation without rales, wheezing or rhonchi  ABDOMEN: Soft, non-tender, non-distended, No pulsatile mass, MUSCULOSKELETAL: No deformity  SKIN: Warm and dry NEUROLOGIC:  Alert and oriented x 3 PSYCHIATRIC:  Normal affect   ASSESSMENT:    1. Chest discomfort   2. Primary hypertension   3. Hyperlipidemia LDL goal <70   4. Educated about COVID-19 virus infection   5. Precordial pain    PLAN:    In order of problems listed above:  1. She has significant risk factors.  Symptoms atypical.  Coronary CTA with FFR if indicated.  Start aspirin 81 mg/day.  Start Toprol-XL 25 mg/day.  Start Crestor 40 mg/day.  Use sublingual nitroglycerin if prolonged chest discomfort. 2. Will titrate blood pressure medication to achieve target of 130/80 mmHg.  Low-salt diet. 3. Rosuvastatin 40 mg/day.  December 2021 LDL was 188. 4. Vaccinated and practicing medication.  Practicing medication.  Overall education and awareness concerning primary risk prevention was discussed in detail: LDL less than 70, hemoglobin A1c less than 7, blood pressure target less than 130/80 mmHg, >150 minutes of moderate aerobic activity per week, avoidance  of smoking, weight control (via diet and exercise), and continued surveillance/management of/for obstructive sleep apnea.    Medication Adjustments/Labs and Tests Ordered: Current medicines are reviewed at length with the patient today.  Concerns regarding medicines are outlined above.  Orders Placed This Encounter  Procedures  . CT CORONARY MORPH W/CTA COR W/SCORE W/CA W/CM &/OR WO/CM  . CT CORONARY FRACTIONAL FLOW RESERVE DATA PREP  . CT CORONARY FRACTIONAL FLOW RESERVE FLUID ANALYSIS  . Basic metabolic panel   Meds ordered this encounter  Medications  . rosuvastatin (CRESTOR) 40 MG tablet    Sig: Take 1 tablet (40 mg total) by mouth daily.    Dispense:  90 tablet    Refill:  3  .  metoprolol succinate (TOPROL XL) 25 MG 24 hr tablet    Sig: Take 1 tablet (25 mg total) by mouth daily.    Dispense:  90 tablet    Refill:  3    Patient Instructions  Medication Instructions:  1) START Rosuvastatin 40m once daily 2) START Metoprolol Succinate 281monce daily  *If you need a refill on your cardiac medications before your next appointment, please call your pharmacy*   Lab Work: BMET today  If you have labs (blood work) drawn today and your tests are completely normal, you will receive your results only by: . Marland KitchenyChart Message (if you have MyChart) OR . A paper copy in the mail If you have any lab test that is abnormal or we need to change your treatment, we will call you to review the results.   Testing/Procedures: Your physician recommends that you have a Coronary CT performed.   Follow-Up: [facility].  As part of our continuing mission to provide you with exceptional heart care, we have created designated Provider Care Teams.  These Care Teams include your primary Cardiologist (physician) and Advanced Practice Providers (APPs -  Physician Assistants and Nurse Practitioners) who all work together to provide you with the care  you need, when you need it.  We recommend signing up for the patient portal called "MyChart".  Sign up information is provided on this After Visit Summary.  MyChart is used to connect with patients for Virtual Visits (Telemedicine).  Patients are able to view lab/test results, encounter notes, upcoming appointments, etc.  Non-urgent messages can be sent to your provider as well.   To learn more about what you can do with MyChart, go to [email or web address]   Your next appointment:   1 month(s)  The format for your next appointment:   In Person  Provider:   You may see Dr. HeDaneen Schickr one of the following Advanced Practice Providers on your designated Care Team:    JiKathyrn DrownNP    Other Instructions  Your cardiac CT will be scheduled at one of the below locations:   [facility] [phone] [phone] scheduled at [facility]please arrive at the [facility] entrance of [facility]0 minutes prior to test start time. Proceed to the [facility] (first floor) to check-in and test prep.  [facility] scheduled at [facility] arrive 15 mins early for check-in and test prep.  Please follow these instructions carefully (unless otherwise directed):  On the Night Before the Test: . Be sure to Drink plenty of water. . Do not consume any caffeinated/decaffeinated beverages or chocolate 12 hours prior to your test. . Do not take any antihistamines 12 hours prior to your test.   On the Day of the Test: . Drink plenty of water. Do not drink any water within one hour of the test. . Do not eat any food 4 hours prior to the test. . You may take your regular medications prior to the test.  . Take metoprolol (Lopressor) two hours prior to  test. . HOLD Furosemide/Hydrochlorothiazide morning of the test. . FEMALES- please wear underwire-free bra if available       After the Test: . Drink plenty of water. . After receiving  IV contrast, you may experience a mild flushed feeling. This is normal. . On occasion, you may experience a mild rash up to 24 hours after the test. This is not dangerous. If this occurs, you can take Benadryl 25 mg and increase your fluid intake. . If you experience trouble breathing, this can be serious. If it is severe call 911 IMMEDIATELY. If it is mild, please call our office. . If you take any of these medications: Glipizide/Metformin, Avandament, Glucavance, please do not take 48 hours after completing test unless otherwise instructed.   Once we have confirmed authorization from your insurance company, we will call you to set up a date and time for your test. Based on how quickly your insurance processes prior authorizations requests, please allow up to 4 weeks to be contacted for scheduling your Cardiac CT appointment. Be advised that routine Cardiac CT appointments could be scheduled as many as 8 weeks after your provider has ordered it.  For non-scheduling related questions, please contact the cardiac imaging nurse navigator should you have any questions/concerns: Marchia Bond, Cardiac Imaging Nurse Navigator Burley Saver, Interim Cardiac Imaging Nurse [place] [facility] Direct Office Dial: [phone]   For scheduling needs, including cancellations and rescheduling, please call [place], [phone].       Signed, Sinclair Grooms, MD  06/20/2020 10:23 AM     [facility]   Addendum: The coronary CTA with FFR is abnormal and suggests severe RCA disease. 06/27/2020: FINDINGS: FFR CT positive in RCA: Abnormality begins in the mid RCA 0.76 and extends through out RCA falling to 0.50 in distal RCA and PDA  LAD is 0.86 in the mid vessel and  falls to 0.72 in the distal vessel  IMPRESSION: Positive FFR CT for hemodynamically significant lesion in mid/distal RCA  Lauren Mcneil, III, MD  07/09/2020

## 2020-06-20 ENCOUNTER — Encounter: Payer: Self-pay | Admitting: Interventional Cardiology

## 2020-06-20 ENCOUNTER — Ambulatory Visit: Payer: Federal, State, Local not specified - PPO | Admitting: Interventional Cardiology

## 2020-06-20 ENCOUNTER — Other Ambulatory Visit: Payer: Self-pay

## 2020-06-20 VITALS — BP 142/88 | HR 64 | Ht 60.0 in | Wt 161.2 lb

## 2020-06-20 DIAGNOSIS — R072 Precordial pain: Secondary | ICD-10-CM

## 2020-06-20 DIAGNOSIS — I10 Essential (primary) hypertension: Secondary | ICD-10-CM

## 2020-06-20 DIAGNOSIS — E785 Hyperlipidemia, unspecified: Secondary | ICD-10-CM

## 2020-06-20 DIAGNOSIS — Z7189 Other specified counseling: Secondary | ICD-10-CM | POA: Diagnosis not present

## 2020-06-20 DIAGNOSIS — R0789 Other chest pain: Secondary | ICD-10-CM | POA: Diagnosis not present

## 2020-06-20 MED ORDER — METOPROLOL SUCCINATE ER 25 MG PO TB24: 25.0000 mg | ORAL_TABLET | Freq: Every day | ORAL | 3 refills | Status: DC

## 2020-06-20 MED ORDER — ROSUVASTATIN CALCIUM 40 MG PO TABS: 40.0000 mg | ORAL_TABLET | Freq: Every day | ORAL | 3 refills | Status: DC

## 2020-06-20 NOTE — Patient Instructions (Signed)
Medication Instructions:  1) START Rosuvastatin 40mg  once daily 2) START Metoprolol Succinate 25mg  once daily  *If you need a refill on your cardiac medications before your next appointment, please call your pharmacy*   Lab Work: BMET today  If you have labs (blood work) drawn today and your tests are completely normal, you will receive your results only by: Marland Kitchen MyChart Message (if you have MyChart) OR . A paper copy in the mail If you have any lab test that is abnormal or we need to change your treatment, we will call you to review the results.   Testing/Procedures: Your physician recommends that you have a Coronary CT performed.   Follow-Up: At [facility], you and your health needs are our priority.  As part of our continuing mission to provide you with exceptional heart care, we have created designated Provider Care Teams.  These Care Teams include your primary Cardiologist (physician) and Advanced Practice Providers (APPs -  Physician Assistants and Nurse Practitioners) who all work together to provide you with the care you need, when you need it.  We recommend signing up for the patient portal called "MyChart".  Sign up information is provided on this After Visit Summary.  MyChart is used to connect with patients for Virtual Visits (Telemedicine).  Patients are able to view lab/test results, encounter notes, upcoming appointments, etc.  Non-urgent messages can be sent to your provider as well.   To learn more about what you can do with MyChart, go to [email or web address].    Your next appointment:   1 month(s)  The format for your next appointment:   In Person  Provider:   You may see Dr. Daneen Schick or one of the following Advanced Practice Providers on your designated Care Team:    Kathyrn Drown, NP    Other Instructions  Your cardiac CT will be scheduled at one of the below locations:   [facility] [address] [place], [place] [address] [phone]  [facility] [address] [place], [place] [address] [phone]  If scheduled at [facility], please arrive at the [facility] main entrance of [facility] 30 minutes prior to test start time. Proceed to the [facility] Radiology Department (first floor) to check-in and test prep.  If scheduled at [facility], please arrive 15 mins early for check-in and test prep.  Please follow these instructions carefully (unless otherwise directed):  On the Night Before the Test: . Be sure to Drink plenty of water. . Do not consume any caffeinated/decaffeinated beverages or chocolate 12 hours prior to your test. . Do not take any antihistamines 12 hours prior to your test.   On the Day of the Test: . Drink plenty of water. Do not drink any water within one hour of the test. . Do not eat any food 4 hours prior to the test. . You may take your regular medications prior to the test.  . Take metoprolol (Lopressor) two hours prior to test. . HOLD Furosemide/Hydrochlorothiazide morning of the test. . FEMALES- please wear underwire-free bra if available       After the Test: . Drink plenty of water. . After receiving IV contrast, you may experience a mild flushed feeling. This is normal. . On occasion, you may experience a mild rash up to 24 hours after the test. This is not dangerous. If this occurs, you can take Benadryl 25 mg and  increase your fluid intake. . If you experience trouble breathing, this can be serious. If it is severe call 911 IMMEDIATELY. If it is mild, please call our office. . If you take any of these medications: Glipizide/Metformin, Avandament, Glucavance, please do not take 48 hours after completing test unless otherwise instructed.   Once we have confirmed authorization from your insurance company, we will call you to set up a date and time for your test. Based on how  quickly your insurance processes prior authorizations requests, please allow up to 4 weeks to be contacted for scheduling your Cardiac CT appointment. Be advised that routine Cardiac CT appointments could be scheduled as many as 8 weeks after your provider has ordered it.  For non-scheduling related questions, please contact the cardiac imaging nurse navigator should you have any questions/concerns: Marchia Bond, Cardiac Imaging Nurse Navigator Burley Saver, Interim Cardiac Imaging Nurse [place] [facility] Direct Office Dial: [phone]   For scheduling needs, including cancellations and rescheduling, please call [place], [phone].

## 2020-06-20 NOTE — Addendum Note (Signed)
Addended by: Julio Sicks on: 06/20/2020 10:37 AM   Modules accepted: Orders

## 2020-06-26 ENCOUNTER — Telehealth (HOSPITAL_COMMUNITY): Payer: Self-pay | Admitting: Emergency Medicine

## 2020-06-26 NOTE — Telephone Encounter (Signed)
Reaching out to patient to offer assistance regarding upcoming cardiac imaging study; pt verbalizes understanding of appt date/time, parking situation and where to check in, pre-test NPO status and medications ordered, and verified current allergies; name and call back number provided for further questions should they arise Kristiana Jacko RN Navigator Cardiac Imaging [facility] [phone] office [phone] cell 

## 2020-06-27 ENCOUNTER — Ambulatory Visit (HOSPITAL_COMMUNITY)
Admission: RE | Admit: 2020-06-27 | Discharge: 2020-06-27 | Disposition: A | Discharge status: Home / Self Care | Payer: Federal, State, Local not specified - PPO | Source: Ambulatory Visit | Attending: Interventional Cardiology | Admitting: Interventional Cardiology

## 2020-06-27 ENCOUNTER — Other Ambulatory Visit: Payer: Self-pay

## 2020-06-27 ENCOUNTER — Encounter (HOSPITAL_COMMUNITY): Payer: Self-pay

## 2020-06-27 DIAGNOSIS — R072 Precordial pain: Secondary | ICD-10-CM | POA: Diagnosis present

## 2020-06-27 DIAGNOSIS — Z006 Encounter for examination for normal comparison and control in clinical research program: Secondary | ICD-10-CM

## 2020-06-27 DIAGNOSIS — I2510 Atherosclerotic heart disease of native coronary artery without angina pectoris: Secondary | ICD-10-CM | POA: Diagnosis not present

## 2020-06-27 MED ORDER — NITROGLYCERIN 0.4 MG SL SUBL
SUBLINGUAL_TABLET | SUBLINGUAL | Status: AC
  Filled 2020-06-27: qty 2

## 2020-06-27 MED ORDER — IOHEXOL 350 MG/ML SOLN
100.0000 mL | Freq: Once | INTRAVENOUS | Status: AC | PRN
  Administered 2020-06-27: 100 mL via INTRAVENOUS

## 2020-06-27 MED ORDER — NITROGLYCERIN 0.4 MG SL SUBL
0.8000 mg | SUBLINGUAL_TABLET | Freq: Once | SUBLINGUAL | Status: AC
  Administered 2020-06-27: 0.8 mg via SUBLINGUAL

## 2020-06-27 NOTE — Research (Signed)
IDENTIFY Informed Consent                  Subject Name: Lauren Mcneil    Subject met inclusion and exclusion criteria.  The informed consent form, study requirements and expectations were reviewed with the subject and questions and concerns were addressed prior to the signing of the consent form.  The subject verbalized understanding of the trial requirements.  The subject agreed to participate in the IDENTIFY trial and signed the informed consent at 13:40PM on 06/27/20.  The informed consent was obtained prior to performance of any protocol-specific procedures for the subject.  A copy of the signed informed consent was given to the subject and a copy was placed in the subject's medical record.   Meade Maw, Naval architect

## 2020-06-28 DIAGNOSIS — I2510 Atherosclerotic heart disease of native coronary artery without angina pectoris: Secondary | ICD-10-CM | POA: Diagnosis not present

## 2020-06-29 ENCOUNTER — Telehealth: Payer: Self-pay | Admitting: *Deleted

## 2020-06-29 ENCOUNTER — Encounter: Payer: Self-pay | Admitting: *Deleted

## 2020-06-29 MED ORDER — METOPROLOL SUCCINATE ER 50 MG PO TB24: 50.0000 mg | ORAL_TABLET | Freq: Every day | ORAL | 3 refills | Status: DC

## 2020-06-29 NOTE — Telephone Encounter (Signed)
Spoke with pt and reviewed results and recommendations.  Pt agreeable to heart cath.  Advised I will call to get this scheduled and will call back today or tomorrow to go over everything.  Pt appreciative for call.

## 2020-06-29 NOTE — Telephone Encounter (Signed)
-----   Message from Lyn Records, MD sent at 06/29/2020  2:28 PM EST ----- Let the patient know she has terrible coronary calcification.  If her chest is tight she should use nitro.  Increase metoprolol to 50 mg/day.  She needs coronary angiography within the next 2 weeks. A copy will be sent to Clinton Sawyer, MD

## 2020-06-30 ENCOUNTER — Ambulatory Visit: Payer: Federal, State, Local not specified - PPO | Admitting: Cardiology

## 2020-07-06 ENCOUNTER — Telehealth: Payer: Self-pay | Admitting: *Deleted

## 2020-07-06 NOTE — Telephone Encounter (Addendum)
Pt contacted pre-catheterization scheduled at [facility] for: Monday July 10, 2020 10:30 AM Verified arrival time and place: [facility] Main Entrance [facility]) at: 8:30 AM   No solid food after midnight prior to cath, clear liquids until 5 AM day of procedure.  Hold: Lisinopril/HCT-AM of procedure  Except hold medications AM meds can be  taken pre-cath with sips of water including: ASA 81 mg   Confirmed patient has responsible adult to drive home post procedure and be with patient first 24 hours after arriving home: yes  You are allowed ONE visitor in the waiting room during the time you are at the hospital for your procedure. Both you and your visitor must wear a mask once you enter the hospital.  Reviewed procedure/mask/visitor instructions with patient.

## 2020-07-07 ENCOUNTER — Other Ambulatory Visit (HOSPITAL_COMMUNITY)
Admission: RE | Admit: 2020-07-07 | Discharge: 2020-07-07 | Disposition: A | Discharge status: Home / Self Care | Payer: Federal, State, Local not specified - PPO | Source: Ambulatory Visit | Attending: Interventional Cardiology | Admitting: Interventional Cardiology

## 2020-07-07 DIAGNOSIS — Z87891 Personal history of nicotine dependence: Secondary | ICD-10-CM | POA: Diagnosis not present

## 2020-07-07 DIAGNOSIS — Z01812 Encounter for preprocedural laboratory examination: Secondary | ICD-10-CM | POA: Insufficient documentation

## 2020-07-07 DIAGNOSIS — E785 Hyperlipidemia, unspecified: Secondary | ICD-10-CM | POA: Diagnosis not present

## 2020-07-07 DIAGNOSIS — Z20822 Contact with and (suspected) exposure to covid-19: Secondary | ICD-10-CM | POA: Insufficient documentation

## 2020-07-07 DIAGNOSIS — Z79899 Other long term (current) drug therapy: Secondary | ICD-10-CM | POA: Diagnosis not present

## 2020-07-07 DIAGNOSIS — I25119 Atherosclerotic heart disease of native coronary artery with unspecified angina pectoris: Secondary | ICD-10-CM | POA: Diagnosis not present

## 2020-07-07 DIAGNOSIS — I10 Essential (primary) hypertension: Secondary | ICD-10-CM | POA: Diagnosis not present

## 2020-07-07 DIAGNOSIS — Z882 Allergy status to sulfonamides status: Secondary | ICD-10-CM | POA: Diagnosis not present

## 2020-07-08 LAB — SARS CORONAVIRUS 2 (TAT 6-24 HRS): SARS Coronavirus 2: NEGATIVE

## 2020-07-09 DIAGNOSIS — I209 Angina pectoris, unspecified: Secondary | ICD-10-CM

## 2020-07-09 DIAGNOSIS — I2510 Atherosclerotic heart disease of native coronary artery without angina pectoris: Secondary | ICD-10-CM

## 2020-07-09 DIAGNOSIS — I10 Essential (primary) hypertension: Secondary | ICD-10-CM

## 2020-07-09 DIAGNOSIS — E785 Hyperlipidemia, unspecified: Secondary | ICD-10-CM

## 2020-07-10 ENCOUNTER — Ambulatory Visit (HOSPITAL_COMMUNITY)
Admission: RE | Admit: 2020-07-10 | Discharge: 2020-07-10 | Disposition: A | Discharge status: Home / Self Care | Payer: Federal, State, Local not specified - PPO | Attending: Interventional Cardiology | Admitting: Interventional Cardiology

## 2020-07-10 ENCOUNTER — Ambulatory Visit (HOSPITAL_COMMUNITY)
Admission: RE | Disposition: A | Discharge status: Home / Self Care | Payer: Self-pay | Source: Home / Self Care | Attending: Interventional Cardiology

## 2020-07-10 DIAGNOSIS — I25119 Atherosclerotic heart disease of native coronary artery with unspecified angina pectoris: Secondary | ICD-10-CM

## 2020-07-10 DIAGNOSIS — Z87891 Personal history of nicotine dependence: Secondary | ICD-10-CM | POA: Insufficient documentation

## 2020-07-10 DIAGNOSIS — Z882 Allergy status to sulfonamides status: Secondary | ICD-10-CM | POA: Insufficient documentation

## 2020-07-10 DIAGNOSIS — I2510 Atherosclerotic heart disease of native coronary artery without angina pectoris: Secondary | ICD-10-CM

## 2020-07-10 DIAGNOSIS — Z79899 Other long term (current) drug therapy: Secondary | ICD-10-CM | POA: Insufficient documentation

## 2020-07-10 DIAGNOSIS — I209 Angina pectoris, unspecified: Secondary | ICD-10-CM

## 2020-07-10 DIAGNOSIS — I10 Essential (primary) hypertension: Secondary | ICD-10-CM | POA: Insufficient documentation

## 2020-07-10 DIAGNOSIS — E785 Hyperlipidemia, unspecified: Secondary | ICD-10-CM

## 2020-07-10 DIAGNOSIS — Z20822 Contact with and (suspected) exposure to covid-19: Secondary | ICD-10-CM | POA: Insufficient documentation

## 2020-07-10 HISTORY — PX: LEFT HEART CATH AND CORONARY ANGIOGRAPHY: CATH118249

## 2020-07-10 SURGERY — LEFT HEART CATH AND CORONARY ANGIOGRAPHY
Anesthesia: LOCAL

## 2020-07-10 MED ORDER — HEPARIN (PORCINE) IN NACL 1000-0.9 UT/500ML-% IV SOLN
INTRAVENOUS | Status: AC
  Filled 2020-07-10: qty 1000

## 2020-07-10 MED ORDER — CLOPIDOGREL BISULFATE 75 MG PO TABS: 75.0000 mg | ORAL_TABLET | Freq: Every day | ORAL | 11 refills | Status: AC

## 2020-07-10 MED ORDER — ACETAMINOPHEN 325 MG PO TABS: 650.0000 mg | ORAL_TABLET | ORAL | Status: DC | PRN

## 2020-07-10 MED ORDER — FENTANYL CITRATE (PF) 100 MCG/2ML IJ SOLN
INTRAMUSCULAR | Status: AC
  Filled 2020-07-10: qty 2

## 2020-07-10 MED ORDER — SODIUM CHLORIDE 0.9% FLUSH: 3.0000 mL | Freq: Two times a day (BID) | INTRAVENOUS | Status: DC

## 2020-07-10 MED ORDER — SODIUM CHLORIDE 0.9 % WEIGHT BASED INFUSION
3.0000 mL/kg/h | INTRAVENOUS | Status: DC
  Administered 2020-07-10: 3 mL/kg/h via INTRAVENOUS

## 2020-07-10 MED ORDER — ASPIRIN 81 MG PO CHEW: 81.0000 mg | CHEWABLE_TABLET | Freq: Every day | ORAL | Status: DC

## 2020-07-10 MED ORDER — HEPARIN SODIUM (PORCINE) 1000 UNIT/ML IJ SOLN
INTRAMUSCULAR | Status: DC | PRN
  Administered 2020-07-10: 3500 [IU] via INTRAVENOUS

## 2020-07-10 MED ORDER — FENTANYL CITRATE (PF) 100 MCG/2ML IJ SOLN
INTRAMUSCULAR | Status: DC | PRN
  Administered 2020-07-10: 25 ug via INTRAVENOUS
  Administered 2020-07-10: 50 ug via INTRAVENOUS

## 2020-07-10 MED ORDER — SODIUM CHLORIDE 0.9 % IV SOLN: 250.0000 mL | INTRAVENOUS | Status: DC | PRN

## 2020-07-10 MED ORDER — METOPROLOL SUCCINATE ER 25 MG PO TB24: 25.0000 mg | ORAL_TABLET | Freq: Every day | ORAL | 11 refills | Status: DC

## 2020-07-10 MED ORDER — CLOPIDOGREL BISULFATE 75 MG PO TABS: 75.0000 mg | ORAL_TABLET | Freq: Every day | ORAL | Status: DC

## 2020-07-10 MED ORDER — HYDRALAZINE HCL 20 MG/ML IJ SOLN: 10.0000 mg | INTRAMUSCULAR | Status: DC | PRN

## 2020-07-10 MED ORDER — SODIUM CHLORIDE 0.9% FLUSH
3.0000 mL | INTRAVENOUS | Status: DC | PRN
Start: 2020-07-10 — End: 2020-07-10

## 2020-07-10 MED ORDER — SODIUM CHLORIDE 0.9 % IV SOLN: INTRAVENOUS | Status: DC

## 2020-07-10 MED ORDER — MIDAZOLAM HCL 2 MG/2ML IJ SOLN
INTRAMUSCULAR | Status: DC | PRN
  Administered 2020-07-10: 0.5 mg via INTRAVENOUS
  Administered 2020-07-10: 1 mg via INTRAVENOUS

## 2020-07-10 MED ORDER — LABETALOL HCL 5 MG/ML IV SOLN: 10.0000 mg | INTRAVENOUS | Status: DC | PRN

## 2020-07-10 MED ORDER — IOHEXOL 350 MG/ML SOLN
INTRAVENOUS | Status: DC | PRN
  Administered 2020-07-10: 50 mL

## 2020-07-10 MED ORDER — ASPIRIN 81 MG PO CHEW: 81.0000 mg | CHEWABLE_TABLET | ORAL | Status: DC

## 2020-07-10 MED ORDER — ONDANSETRON HCL 4 MG/2ML IJ SOLN: 4.0000 mg | Freq: Four times a day (QID) | INTRAMUSCULAR | Status: DC | PRN

## 2020-07-10 MED ORDER — HEPARIN SODIUM (PORCINE) 1000 UNIT/ML IJ SOLN
INTRAMUSCULAR | Status: AC
  Filled 2020-07-10: qty 1

## 2020-07-10 MED ORDER — HEPARIN (PORCINE) IN NACL 1000-0.9 UT/500ML-% IV SOLN
INTRAVENOUS | Status: DC | PRN
  Administered 2020-07-10: 500 mL

## 2020-07-10 MED ORDER — VERAPAMIL HCL 2.5 MG/ML IV SOLN
INTRAVENOUS | Status: AC
  Filled 2020-07-10: qty 2

## 2020-07-10 MED ORDER — LIDOCAINE HCL (PF) 1 % IJ SOLN
INTRAMUSCULAR | Status: AC
  Filled 2020-07-10: qty 30

## 2020-07-10 MED ORDER — ASPIRIN EC 81 MG PO TBEC: 81.0000 mg | DELAYED_RELEASE_TABLET | Freq: Every day | ORAL | 2 refills | Status: AC

## 2020-07-10 MED ORDER — SODIUM CHLORIDE 0.9% FLUSH: 3.0000 mL | INTRAVENOUS | Status: DC | PRN

## 2020-07-10 MED ORDER — VERAPAMIL HCL 2.5 MG/ML IV SOLN
INTRAVENOUS | Status: DC | PRN
  Administered 2020-07-10: 10 mL via INTRA_ARTERIAL

## 2020-07-10 MED ORDER — MIDAZOLAM HCL 2 MG/2ML IJ SOLN
INTRAMUSCULAR | Status: AC
  Filled 2020-07-10: qty 2

## 2020-07-10 MED ORDER — SODIUM CHLORIDE 0.9 % WEIGHT BASED INFUSION
1.0000 mL/kg/h | INTRAVENOUS | Status: DC
  Administered 2020-07-10: 500 mL via INTRAVENOUS

## 2020-07-10 SURGICAL SUPPLY — 12 items
BAG SNAP BAND KOVER 36X36 (MISCELLANEOUS) ×2 IMPLANT
CATH 5FR JL3.5 JR4 ANG PIG MP (CATHETERS) ×2 IMPLANT
COVER DOME SNAP 22 D (MISCELLANEOUS) ×2 IMPLANT
DEVICE RAD COMP TR BAND LRG (VASCULAR PRODUCTS) ×2 IMPLANT
GLIDESHEATH SLEND A-KIT 6F 22G (SHEATH) ×2 IMPLANT
GUIDEWIRE INQWIRE 1.5J.035X260 (WIRE) ×1 IMPLANT
INQWIRE 1.5J .035X260CM (WIRE) ×2
KIT HEART LEFT (KITS) ×2 IMPLANT
PACK CARDIAC CATHETERIZATION (CUSTOM PROCEDURE TRAY) ×2 IMPLANT
SHEATH PROBE COVER 6X72 (BAG) ×2 IMPLANT
TRANSDUCER W/STOPCOCK (MISCELLANEOUS) ×2 IMPLANT
TUBING CIL FLEX 10 FLL-RA (TUBING) ×2 IMPLANT

## 2020-07-10 NOTE — Discharge Instructions (Signed)
Starting a new medication, clopidogrel 75 mg/day, coated aspirin 81 mg/day, and metoprolol succinate 25 mg/day.  Arrange office visit to see Dr. Katrinka Blazing in 3 to 4 weeks.  Phase 2 cardiac rehab   Radial Site Care  This sheet gives you information about how to care for yourself after your procedure. Your health care provider may also give you more specific instructions. If you have problems or questions, contact your health care provider. What can I expect after the procedure? After the procedure, it is common to have:  Bruising and tenderness at the catheter insertion area. Follow these instructions at home: Medicines  Take over-the-counter and prescription medicines only as told by your health care provider. Insertion site care  Follow instructions from your health care provider about how to take care of your insertion site. Make sure you: ? Wash your hands with soap and water before you change your bandage (dressing). If soap and water are not available, use hand sanitizer. ? Change your dressing as told by your health care provider. ? Leave stitches (sutures), skin glue, or adhesive strips in place. These skin closures may need to stay in place for 2 weeks or longer. If adhesive strip edges start to loosen and curl up, you may trim the loose edges. Do not remove adhesive strips completely unless your health care provider tells you to do that.  Check your insertion site every day for signs of infection. Check for: ? Redness, swelling, or pain. ? Fluid or blood. ? Pus or a bad smell. ? Warmth.  Do not take baths, swim, or use a hot tub until your health care provider approves.  You may shower 24-48 hours after the procedure, or as directed by your health care provider. ? Remove the dressing and gently wash the site with plain soap and water. ? Pat the area dry with a clean towel. ? Do not rub the site. That could cause bleeding.  Do not apply powder or lotion to the  site. Activity  For 24 hours after the procedure, or as directed by your health care provider: ? Do not flex or bend the affected arm. ? Do not push or pull heavy objects with the affected arm. ? Do not drive yourself home from the hospital or clinic. You may drive 24 hours after the procedure unless your health care provider tells you not to. ? Do not operate machinery or power tools.  Do not lift anything that is heavier than 10 lb (4.5 kg), or the limit that you are told, until your health care provider says that it is safe.  Ask your health care provider when it is okay to: ? Return to work or school. ? Resume usual physical activities or sports. ? Resume sexual activity.   General instructions  If the catheter site starts to bleed, raise your arm and put firm pressure on the site. If the bleeding does not stop, get help right away. This is a medical emergency.  If you went home on the same day as your procedure, a responsible adult should be with you for the first 24 hours after you arrive home.  Keep all follow-up visits as told by your health care provider. This is important. Contact a health care provider if:  You have a fever.  You have redness, swelling, or yellow drainage around your insertion site. Get help right away if:  You have unusual pain at the radial site.  The catheter insertion area swells very fast.  The insertion area is bleeding, and the bleeding does not stop when you hold steady pressure on the area.  Your arm or hand becomes pale, cool, tingly, or numb. These symptoms may represent a serious problem that is an emergency. Do not wait to see if the symptoms will go away. Get medical help right away. Call your local emergency services (911 in the [place] Do not drive yourself to the hospital. Summary  After the procedure, it is common to have bruising and tenderness at the site.  Follow instructions from your health care provider about how to take care  of your radial site wound. Check the wound every day for signs of infection.  Do not lift anything that is heavier than 10 lb (4.5 kg), or the limit that you are told, until your health care provider says that it is safe. This information is not intended to replace advice given to you by your health care provider. Make sure you discuss any questions you have with your health care provider. Document Revised: 06/11/2017 Document Reviewed: 06/11/2017 Elsevier Patient Education  2021 [employer].

## 2020-07-10 NOTE — Interval H&P Note (Signed)
Cath Lab Visit (complete for each Cath Lab visit)  Clinical Evaluation Leading to the Procedure:   ACS: No.  Non-ACS:    Anginal Classification: CCS II  Anti-ischemic medical therapy: Minimal Therapy (1 class of medications)  Non-Invasive Test Results: Intermediate-risk stress test findings: cardiac mortality 1-3%/year  Prior CABG: No previous CABG      History and Physical Interval Note:  07/10/2020 10:42 AM  Lauren Mcneil  has presented today for surgery, with the diagnosis of chest pain, abnormal CT.  The various methods of treatment have been discussed with the patient and family. After consideration of risks, benefits and other options for treatment, the patient has consented to  Procedure(s): LEFT HEART CATH AND CORONARY ANGIOGRAPHY (N/A) as a surgical intervention.  The patient's history has been reviewed, patient examined, no change in status, stable for surgery.  I have reviewed the patient's chart and labs.  Questions were answered to the patient's satisfaction.     Lyn Records III

## 2020-07-10 NOTE — CV Procedure (Signed)
   Distal LAD beyond bifurcation with a large diagonal contains greater than 90% stenosis after an acute bend from the LAD.  Moderate proximal, mid, and distal RCA disease.  Circumflex is free of disease.  Marked right and left coronary calcification with significant RCA and LAD tortuosity.  Normal LV function.  LVEDP 5 mmHg.  CONCLUSION: Likely culprit for angina is the mid to distal LAD which lies in a calcified segment, and is beyond significant tortuosity and angulation.  Medical therapy should be attempted rather than intervention due to the high likelihood that a PCI result will be complicated and could perhaps cause harm.  RECOMMENDATIONS: Medical therapy with risk factor modification, sublingual nitro for pain control, and dual antiplatelet therapy for least 6 months with aspirin and Plavix.

## 2020-07-11 ENCOUNTER — Other Ambulatory Visit: Payer: Self-pay | Admitting: *Deleted

## 2020-07-11 ENCOUNTER — Encounter (HOSPITAL_COMMUNITY): Payer: Self-pay | Admitting: Interventional Cardiology

## 2020-07-11 DIAGNOSIS — R0789 Other chest pain: Secondary | ICD-10-CM

## 2020-07-11 MED FILL — Lidocaine HCl Local Preservative Free (PF) Inj 1%: INTRAMUSCULAR | Qty: 30 | Status: AC

## 2020-07-23 NOTE — Progress Notes (Unsigned)
Cardiology Office Note:    Date:  07/24/2020   ID:  Lauren Mcneil, DOB Apr 12, 1961, MRN [id number]  PCP:  Clinton Sawyer, MD  Cardiologist:  No primary care provider on file.   Referring MD: Clinton Sawyer, MD   Chief Complaint  Patient presents with  . Coronary Artery Disease    History of Present Illness:    Lauren Mcneil is a 60 y.o. female with a hx of primary hypertension, hyperlipidemia, and CAD with angina. Coronary angiography with small severely diseased LAD.  Angiography has been performed and is noted below.  No recurrence of angina since medication adjustments.  Plan primary prevention.  DC Plavix after 1 month.  Use sublingual nitroglycerin if chest pain.  Never use chlorhexidine again because she has severe skin eruption in both her right arm and right iliofemoral region after ChloraPrep  Past Medical History:  Diagnosis Date  . GERD (gastroesophageal reflux disease)   . Hypercholesteremia   . Hypertension     Past Surgical History:  Procedure Laterality Date  . ABDOMINAL HYSTERECTOMY    . LEFT HEART CATH AND CORONARY ANGIOGRAPHY N/A 07/10/2020   Procedure: LEFT HEART CATH AND CORONARY ANGIOGRAPHY;  Surgeon: Lyn Records, MD;  Location: [facility] INVASIVE CV LAB;  Service: Cardiovascular;  Laterality: N/A;  . VAGINAL DELIVERY      Current Medications: Current Meds  Medication Sig  . aspirin EC 81 MG tablet Take 1 tablet (81 mg total) by mouth daily. Swallow whole.  . clopidogrel (PLAVIX) 75 MG tablet Take 1 tablet (75 mg total) by mouth daily.  Marland Kitchen lisinopril-hydrochlorothiazide (ZESTORETIC) 10-12.5 MG tablet Take 1 tablet by mouth daily.  . metoprolol succinate (TOPROL XL) 25 MG 24 hr tablet Take 1 tablet (25 mg total) by mouth daily.  . nitroGLYCERIN (NITROSTAT) 0.4 MG SL tablet Place 1 tablet (0.4 mg total) under the tongue every 5 (five) minutes as needed for chest pain.  Marland Kitchen omeprazole-sodium bicarbonate (ZEGERID) 40-1100 MG capsule Take 1 capsule by mouth daily.   . rosuvastatin (CRESTOR) 40 MG tablet Take 1 tablet (40 mg total) by mouth daily.     Allergies:   Sulfa antibiotics and Chlorhexidine   Social History   Socioeconomic History  . Marital status: Married    Spouse name: Not on file  . Number of children: Not on file  . Years of education: Not on file  . Highest education level: Not on file  Occupational History  . Not on file  Tobacco Use  . Smoking status: Former Smoker    Types: Cigarettes    Quit date: 2003    Years since quitting: 19.1  . Smokeless tobacco: Never Used  Vaping Use  . Vaping Use: Never used  Substance and Sexual Activity  . Alcohol use: Never  . Drug use: Never  . Sexual activity: Yes  Other Topics Concern  . Not on file  Social History Narrative  . Not on file   Social Determinants of Health   Financial Resource Strain: Not on file  Food Insecurity: Not on file  Transportation Needs: Not on file  Physical Activity: Not on file  Stress: Not on file  Social Connections: Not on file     Family History: The patient's family history includes Diabetes in her mother; Heart attack in her father.  ROS:   Please see the history of present illness.    No new data all other systems reviewed and are negative.  EKGs/Labs/Other Studies Reviewed:  The following studies were reviewed today:  CARDIAC CATH 2022 Diagnostic Dominance: Right     EKG:  EKG not repeated  Recent Labs: 06/17/2020: ALT 21; BUN 19; Creatinine, Ser 0.71; Hemoglobin 13.4; Platelets 239; Potassium 3.9; Sodium 132  Recent Lipid Panel No results found for: CHOL, TRIG, HDL, CHOLHDL, VLDL, LDLCALC, LDLDIRECT  Physical Exam:    VS:  BP 122/76   Pulse 84   Ht 5' (1.524 m)   Wt 160 lb (72.6 kg)   SpO2 100%   BMI 31.25 kg/m     Wt Readings from Last 3 Encounters:  07/24/20 160 lb (72.6 kg)  07/10/20 159 lb (72.1 kg)  06/20/20 161 lb 3.2 oz (73.1 kg)     GEN: Overweight. No acute distress HEENT: Normal NECK: No  JVD. LYMPHATICS: No lymphadenopathy CARDIAC: No murmur. RRR no gallop, or edema. VASCULAR:  Normal Pulses. No bruits. RESPIRATORY:  Clear to auscultation without rales, wheezing or rhonchi  ABDOMEN: Soft, non-tender, non-distended, No pulsatile mass, MUSCULOSKELETAL: No deformity  SKIN: Warm and dry NEUROLOGIC:  Alert and oriented x 3 PSYCHIATRIC:  Normal affect   ASSESSMENT:    1. Chest discomfort   2. Primary hypertension   3. Hyperlipidemia LDL goal <70   4. Educated about COVID-19 virus infection    PLAN:    In order of problems listed above:  1. No recurrence.  Continue lipid-lowering, 150 minutes of moderate activity per week, LDL less than 70.  Triglyceride if recurrent chest pain.  Discontinued Plavix after 1 month 2. Current blood pressure is excellent. 3. Crestor 40 mg/day.  Lipid panel April 1. 4. Not discussed  Overall education and awareness concerning primary risk prevention was discussed in detail: LDL less than 70, hemoglobin A1c less than 7, blood pressure target less than 130/80 mmHg, >150 minutes of moderate aerobic activity per week, avoidance of smoking, weight control (via diet and exercise), and continued surveillance/management of/for obstructive sleep apnea.    Medication Adjustments/Labs and Tests Ordered: Current medicines are reviewed at length with the patient today.  Concerns regarding medicines are outlined above.  No orders of the defined types were placed in this encounter.  No orders of the defined types were placed in this encounter.   Patient Instructions  Medication Instructions:  1) DISCONTINUE Plavix after 08/08/20 *If you need a refill on your cardiac medications before your next appointment, please call your pharmacy*   Lab Work: Lipid and Liver in early April.  You will need to be fasting for these labs (nothing to eat or drink after midnight except water and black coffee).  If you have labs (blood work) drawn today and your  tests are completely normal, you will receive your results only by: Marland Kitchen MyChart Message (if you have MyChart) OR . A paper copy in the mail If you have any lab test that is abnormal or we need to change your treatment, we will call you to review the results.   Testing/Procedures: None   Follow-Up: At [facility], you and your health needs are our priority.  As part of our continuing mission to provide you with exceptional heart care, we have created designated Provider Care Teams.  These Care Teams include your primary Cardiologist (physician) and Advanced Practice Providers (APPs -  Physician Assistants and Nurse Practitioners) who all work together to provide you with the care you need, when you need it.  We recommend signing up for the patient portal called "MyChart".  Sign up information is provided  on this After Visit Summary.  MyChart is used to connect with patients for Virtual Visits (Telemedicine).  Patients are able to view lab/test results, encounter notes, upcoming appointments, etc.  Non-urgent messages can be sent to your provider as well.   To learn more about what you can do with MyChart, go to [email or web address].    Your next appointment:   9-12 month(s)  The format for your next appointment:   In Person  Provider:   You may see Dr. Verdis Prime or one of the following Advanced Practice Providers on your designated Care Team:    Georgie Chard, NP    Other Instructions      Signed, Lesleigh Noe, MD  07/24/2020 3:23 PM    [facility]

## 2020-07-24 ENCOUNTER — Ambulatory Visit: Payer: Federal, State, Local not specified - PPO | Admitting: Interventional Cardiology

## 2020-07-24 ENCOUNTER — Other Ambulatory Visit: Payer: Self-pay

## 2020-07-24 ENCOUNTER — Encounter: Payer: Self-pay | Admitting: Interventional Cardiology

## 2020-07-24 VITALS — BP 122/76 | HR 84 | Ht 60.0 in | Wt 160.0 lb

## 2020-07-24 DIAGNOSIS — Z7189 Other specified counseling: Secondary | ICD-10-CM | POA: Diagnosis not present

## 2020-07-24 DIAGNOSIS — I10 Essential (primary) hypertension: Secondary | ICD-10-CM | POA: Diagnosis not present

## 2020-07-24 DIAGNOSIS — E785 Hyperlipidemia, unspecified: Secondary | ICD-10-CM | POA: Diagnosis not present

## 2020-07-24 DIAGNOSIS — R0789 Other chest pain: Secondary | ICD-10-CM | POA: Diagnosis not present

## 2020-07-24 NOTE — Addendum Note (Signed)
Addended by: Julio Sicks on: 07/24/2020 03:28 PM   Modules accepted: Orders

## 2020-07-24 NOTE — Patient Instructions (Signed)
Medication Instructions:  1) DISCONTINUE Plavix after 08/08/20 *If you need a refill on your cardiac medications before your next appointment, please call your pharmacy*   Lab Work: Lipid and Liver in early April.  You will need to be fasting for these labs (nothing to eat or drink after midnight except water and black coffee).  If you have labs (blood work) drawn today and your tests are completely normal, you will receive your results only by: Marland Kitchen MyChart Message (if you have MyChart) OR . A paper copy in the mail If you have any lab test that is abnormal or we need to change your treatment, we will call you to review the results.   Testing/Procedures: None   Follow-Up: At [facility], you and your health needs are our priority.  As part of our continuing mission to provide you with exceptional heart care, we have created designated Provider Care Teams.  These Care Teams include your primary Cardiologist (physician) and Advanced Practice Providers (APPs -  Physician Assistants and Nurse Practitioners) who all work together to provide you with the care you need, when you need it.  We recommend signing up for the patient portal called "MyChart".  Sign up information is provided on this After Visit Summary.  MyChart is used to connect with patients for Virtual Visits (Telemedicine).  Patients are able to view lab/test results, encounter notes, upcoming appointments, etc.  Non-urgent messages can be sent to your provider as well.   To learn more about what you can do with MyChart, go to [email or web address].    Your next appointment:   9-12 month(s)  The format for your next appointment:   In Person  Provider:   You may see Dr. Verdis Prime or one of the following Advanced Practice Providers on your designated Care Team:    Georgie Chard, NP    Other Instructions

## 2020-07-27 ENCOUNTER — Telehealth: Payer: Self-pay | Admitting: Interventional Cardiology

## 2020-07-27 NOTE — Telephone Encounter (Signed)
Lauren Mcneil from [facility] in [place], [place]. The patient interviewed for the rehab program today and the facility just needs some office visit notes before the patient can start cardiac rehab. Please fax notes to [phone]

## 2020-07-27 NOTE — Telephone Encounter (Signed)
Will send to med rec dept and Dr. Michaelle Copas nurse Lilyan Gilford RN.

## 2020-08-21 ENCOUNTER — Other Ambulatory Visit: Payer: Federal, State, Local not specified - PPO

## 2020-08-28 ENCOUNTER — Other Ambulatory Visit: Payer: Federal, State, Local not specified - PPO

## 2020-09-04 ENCOUNTER — Other Ambulatory Visit: Payer: Self-pay

## 2020-09-04 ENCOUNTER — Other Ambulatory Visit: Payer: Federal, State, Local not specified - PPO | Admitting: *Deleted

## 2020-09-04 DIAGNOSIS — E785 Hyperlipidemia, unspecified: Secondary | ICD-10-CM

## 2020-09-04 LAB — HEPATIC FUNCTION PANEL
ALT: 26 IU/L (ref 0–32)
AST: 20 IU/L (ref 0–40)
Albumin: 4.5 g/dL (ref 3.8–4.9)
Alkaline Phosphatase: 64 IU/L (ref 44–121)
Bilirubin Total: 0.3 mg/dL (ref 0.0–1.2)
Bilirubin, Direct: <0.1 mg/dL (ref 0.00–0.40)
Total Protein: 6.9 g/dL (ref 6.0–8.5)

## 2020-09-04 LAB — LIPID PANEL
Chol/HDL Ratio: 3.5 ratio (ref 0.0–4.4)
Cholesterol, Total: 159 mg/dL (ref 100–199)
HDL: 45 mg/dL (ref >39)
LDL Chol Calc (NIH): 94 mg/dL (ref 0–99)
Triglycerides: 113 mg/dL (ref 0–149)
VLDL Cholesterol Cal: 20 mg/dL (ref 5–40)

## 2020-09-05 ENCOUNTER — Other Ambulatory Visit: Payer: Self-pay | Admitting: *Deleted

## 2020-09-05 MED ORDER — EZETIMIBE 10 MG PO TABS: 10.0000 mg | ORAL_TABLET | Freq: Every day | ORAL | 3 refills | Status: DC

## 2020-10-10 ENCOUNTER — Telehealth: Payer: Self-pay

## 2020-10-10 DIAGNOSIS — Z006 Encounter for examination for normal comparison and control in clinical research program: Secondary | ICD-10-CM

## 2020-10-10 NOTE — Telephone Encounter (Signed)
Called patient for 90 day Identify phone call, patient stated she not having any of the cardiac symptoms that brought her in, but stated that she has followed up with her doctor. She did also inform me they performed further diagnostic testing and procedures in which she had a heart catherterization on 07/10/2020 . Lastly I informed her we would be calling her one once more for a year follow up call regarding a follow up in the study.

## 2021-04-05 ENCOUNTER — Telehealth: Payer: Self-pay | Admitting: Interventional Cardiology

## 2021-04-05 NOTE — Telephone Encounter (Signed)
Triage call received.  Pt of Dr. Katrinka Blazing with known CAD.  States she was awoken this AM at 4:40 am with "excruciating" chest pain that radiated to her back.  She took a nitro with no relief.  She then chewed 2 aspirins.   She states by 5:40 am she had relief.  She denies current chest pain.  Scheduled Pt with DOD tomorrow 04/06/2021 for evaluation.  Strongly advised if chest pain reoccurred that she should seek immediate emergency care.

## 2021-04-05 NOTE — Telephone Encounter (Signed)
Pt c/o of Chest Pain: STAT if CP now or developed within 24 hours  1. Are you having CP right now? No  2. Are you experiencing any other symptoms (ex. SOB, nausea, vomiting, sweating)? Lightheaded and clammy stopped after aspirin    3. How long have you been experiencing CP? Started this morning 10 mins to 5:00 AM stopped around 5:40 AM  4. Is your CP continuous or coming and going? Continuous   5. Have you taken Nitroglycerin? Yes, and two aspirin  ?

## 2021-04-05 NOTE — Progress Notes (Signed)
Cardiology Office Note   Date:  04/06/2021   ID:  [name], [name] [birth year], MRN [id number]  PCP:  Clinton Sawyer, MD    No chief complaint on file.  CAD  Wt Readings from Last 3 Encounters:  04/06/21 166 lb 6.4 oz (75.5 kg)  07/24/20 160 lb (72.6 kg)  07/10/20 159 lb (72.1 kg)       History of Present Illness: Lauren Mcneil is a 60 y.o. female  with CAD.  Cath from 06/2020 shows: "LAD does not wrap around the left ventricular apex.  The distal third of the LAD arises at an angle from a large second diagonal and contains 90 to 95% stenosis within the vessel that is 2 mm in diameter or less.  The LAD does not wraparound the apex.  LAD proximal and mid segment is tortuous. Left main is widely patent. 2 obtuse marginal branches arise from the circumflex.  There is 30 to 40% narrowing before the origin of the first obtuse marginal. RCA is elongated, contains diffuse atherosclerosis without high-grade proximal or mid obstruction.  Distally before the distalmost right ventricular branch there is 50% eccentric stenosis.  The PDA is large and wraps around the left ventricular apex and also contains diffuse up to 50% narrowing. Normal LV function.  LVEDP 5 mmHg.   RECOMMENDATIONS:   Aggressive risk factor modification Chronic beta-blocker therapy, Toprol-XL 25 mg/day. Sublingual nitroglycerin if chest discomfort. PCI on the distal LAD would be complicated due to tortuosity/angulation, distal location, and small diameter.  Medical therapy is best treatment option unless symptoms force otherwise."  Yesterday morning, she had chest pressure.  She took SL NTG x 2 and aspirin x 2. Pain did resolve over 40 minutes.    She did a lot of walking this week as her job changed.  Walking is most strenuous exercise.  Feels some DOE and higher HR with strenuous activity.   Hiked last time in September. Visited [place].  Denies : Dizziness. Leg edema.  Orthopnea. Palpitations. Paroxysmal  nocturnal dyspnea. Syncope.      Past Medical History:  Diagnosis Date   GERD (gastroesophageal reflux disease)    Hypercholesteremia    Hypertension     Past Surgical History:  Procedure Laterality Date   ABDOMINAL HYSTERECTOMY     LEFT HEART CATH AND CORONARY ANGIOGRAPHY N/A 07/10/2020   Procedure: LEFT HEART CATH AND CORONARY ANGIOGRAPHY;  Surgeon: Lyn Records, MD;  Location: [facility];  Service: Cardiovascular;  Laterality: N/A;   VAGINAL DELIVERY       Current Outpatient Medications  Medication Sig Dispense Refill   aspirin EC 81 MG tablet Take 1 tablet (81 mg total) by mouth daily. Swallow whole. 150 tablet 2   atorvastatin (LIPITOR) 20 MG tablet Take 20 mg by mouth daily.     clopidogrel (PLAVIX) 75 MG tablet Take 1 tablet (75 mg total) by mouth daily. 30 tablet 11   lisinopril-hydrochlorothiazide (ZESTORETIC) 10-12.5 MG tablet Take 1 tablet by mouth daily.     metoprolol succinate (TOPROL XL) 25 MG 24 hr tablet Take 1 tablet (25 mg total) by mouth daily. 30 tablet 11   nitroGLYCERIN (NITROSTAT) 0.4 MG SL tablet Place 1 tablet (0.4 mg total) under the tongue every 5 (five) minutes as needed for chest pain. 30 tablet 0   omeprazole-sodium bicarbonate (ZEGERID) 40-1100 MG capsule Take 1 capsule by mouth daily.     ezetimibe (ZETIA) 10 MG tablet Take 1 tablet (10  mg total) by mouth daily. (Patient not taking: Reported on 04/06/2021) 90 tablet 3   No current facility-administered medications for this visit.    Allergies:   Sulfa antibiotics and Chlorhexidine    Social History:  The patient  reports that she quit smoking about 19 years ago. Her smoking use included cigarettes. She has never used smokeless tobacco. She reports that she does not drink alcohol and does not use drugs.   Family History:  The patient's family history includes Diabetes in her mother; Heart attack in her father.    ROS:  Please see the history of present illness.   Otherwise, review of  systems are positive for recent chest pain.   All other systems are reviewed and negative.    PHYSICAL EXAM: VS:  BP 116/78   Pulse (!) 57   Ht 5' (1.524 m)   Wt 166 lb 6.4 oz (75.5 kg)   SpO2 94%   BMI 32.50 kg/m  , BMI Body mass index is 32.5 kg/m. GEN: Well nourished, well developed, in no acute distress HEENT: normal Neck: no JVD, carotid bruits, or masses Cardiac: RRR; no murmurs, rubs, or gallops,no edema  Respiratory:  clear to auscultation bilaterally, normal work of breathing GI: soft, nontender, nondistended, + BS MS: no deformity or atrophy Skin: warm and dry, no rash Neuro:  Strength and sensation are intact Psych: euthymic mood, full affect   EKG:   The ekg ordered today demonstrates normal sinus rhythm, no ST changes, no Q waves, no change from prior   Recent Labs: 06/17/2020: BUN 19; Creatinine, Ser 0.71; Hemoglobin 13.4; Platelets 239; Potassium 3.9; Sodium 132 09/04/2020: ALT 26   Lipid Panel    Component Value Date/Time   CHOL 159 09/04/2020 1100   TRIG 113 09/04/2020 1100   HDL 45 09/04/2020 1100   CHOLHDL 3.5 09/04/2020 1100   LDLCALC 94 09/04/2020 1100     Other studies Reviewed: Additional studies/ records that were reviewed today with results demonstrating: I personally reviewed her Films.  I agree there are no interventional options due to the small distal vessels where her most severe stenosis occurs.   ASSESSMENT AND PLAN:  CAD: Episode of angina yesterday.  No changes on ECG.  She has been walking without any issues at work.  We will start isosorbide 15 mg daily.  May need to increase to 30 mg daily if symptoms recur.  We will also give a new prescription for sublingual nitroglycerin.  For long-term management, I would consider changing her aspirin to clopidogrel 75 mg daily.  We will defer to Dr. Katrinka Blazing. HTN: The current medical regimen is effective;  continue present plan and medications. Hyperlpidemia: Now on atorvastatin 20 mg daily.  LDL  94 in 08/2020.  She would benefit from high potency statin.  Increase to 40 mg daily. Labs in January end.  Would like to see LDL below 70 at least.  She does not recall having any side effects to rosuvastatin.  Since she is already on atorvastatin, will increase dose.    Current medicines are reviewed at length with the patient today.  The patient concerns regarding her medicines were addressed.  The following changes have been made: As above  Labs/ tests ordered today include:  No orders of the defined types were placed in this encounter.   Recommend 150 minutes/week of aerobic exercise Low fat, low carb, high fiber diet recommended  Disposition:   FU in 3 months with Dr. Katrinka Blazing   Signed,  Lance Muss, MD  04/06/2021 8:09 AM    [facility] [address] [place], [place], [place]  [address] Phone: [phone]; Fax: [phone]

## 2021-04-06 ENCOUNTER — Encounter: Payer: Self-pay | Admitting: Interventional Cardiology

## 2021-04-06 ENCOUNTER — Other Ambulatory Visit: Payer: Self-pay

## 2021-04-06 ENCOUNTER — Ambulatory Visit: Payer: Federal, State, Local not specified - PPO | Admitting: Interventional Cardiology

## 2021-04-06 ENCOUNTER — Other Ambulatory Visit: Payer: Self-pay | Admitting: Interventional Cardiology

## 2021-04-06 VITALS — BP 116/78 | HR 57 | Ht 60.0 in | Wt 166.4 lb

## 2021-04-06 DIAGNOSIS — I25118 Atherosclerotic heart disease of native coronary artery with other forms of angina pectoris: Secondary | ICD-10-CM

## 2021-04-06 DIAGNOSIS — I10 Essential (primary) hypertension: Secondary | ICD-10-CM

## 2021-04-06 DIAGNOSIS — E785 Hyperlipidemia, unspecified: Secondary | ICD-10-CM

## 2021-04-06 MED ORDER — ISOSORBIDE MONONITRATE ER 30 MG PO TB24: ORAL_TABLET | ORAL | 3 refills | Status: DC

## 2021-04-06 MED ORDER — NITROGLYCERIN 0.4 MG SL SUBL: 0.4000 mg | SUBLINGUAL_TABLET | SUBLINGUAL | 6 refills | Status: DC | PRN

## 2021-04-06 MED ORDER — ATORVASTATIN CALCIUM 40 MG PO TABS: 40.0000 mg | ORAL_TABLET | Freq: Every day | ORAL | 3 refills | Status: DC

## 2021-04-06 NOTE — Patient Instructions (Signed)
Medication Instructions:  Your physician has recommended you make the following change in your medication: Start Isosorbide mononitrate.  Start with 15 mg daily (half of a 30 mg tablet).  Increase to whole tablet if needed. Increase Atorvastatin to 40 mg by mouth daily.  *If you need a refill on your cardiac medications before your next appointment, please call your pharmacy*   Lab Work: Your physician recommends that you return for lab work the end of January--Lipid and Liver profiles.  This will be fasting.  The lab opens at 7:30 AM  If you have labs (blood work) drawn today and your tests are completely normal, you will receive your results only by: MyChart Message (if you have MyChart) OR A paper copy in the mail If you have any lab test that is abnormal or we need to change your treatment, we will call you to review the results.   Testing/Procedures: none   Follow-Up: At [facility], you and your health needs are our priority.  As part of our continuing mission to provide you with exceptional heart care, we have created designated Provider Care Teams.  These Care Teams include your primary Cardiologist (physician) and Advanced Practice Providers (APPs -  Physician Assistants and Nurse Practitioners) who all work together to provide you with the care you need, when you need it.  We recommend signing up for the patient portal called "MyChart".  Sign up information is provided on this After Visit Summary.  MyChart is used to connect with patients for Virtual Visits (Telemedicine).  Patients are able to view lab/test results, encounter notes, upcoming appointments, etc.  Non-urgent messages can be sent to your provider as well.   To learn more about what you can do with MyChart, go to [email or web address].    Your next appointment:   3 month(s)  The format for your next appointment:   In Person  Provider:   Dr Katrinka Blazing or Chelsea Aus, PA-C, Ronie Spies, PA-C, Nada Boozer, NP,  Jacolyn Reedy, PA-C, or Tereso Newcomer, PA-C        :1}    Other Instructions

## 2021-04-06 NOTE — Telephone Encounter (Signed)
Pharmacy is requesting directions for isosorbide. Please address and resend. Thanks

## 2021-04-06 NOTE — Telephone Encounter (Signed)
Prescription for Imdur 30 mg once daily sent to pharmacy

## 2021-06-15 ENCOUNTER — Telehealth: Payer: Self-pay | Admitting: Interventional Cardiology

## 2021-06-15 NOTE — Telephone Encounter (Signed)
Patient is requesting to have her lab orders sent to [facility] at [address] In [place], [place] for convenience.

## 2021-06-15 NOTE — Telephone Encounter (Signed)
Lab [employer] customer service contacted to verify fax number for [facility] on [address] and was informed that this location no longer exist. [facility] in [place] is located at [employer] phone number [phone] and fax number [phone].  Contacted patient to make aware of only [facility] location in [place] and says she go to this location Lab orders faxed to [phone].  Aware to be fasting

## 2021-06-18 ENCOUNTER — Other Ambulatory Visit: Payer: Self-pay | Admitting: Interventional Cardiology

## 2021-06-18 ENCOUNTER — Other Ambulatory Visit: Payer: Federal, State, Local not specified - PPO

## 2021-06-19 LAB — LIPID PANEL W/O CHOL/HDL RATIO
Cholesterol, Total: 206 mg/dL — ABNORMAL HIGH (ref 100–199)
HDL: 39 mg/dL — ABNORMAL LOW (ref >39)
LDL Chol Calc (NIH): 139 mg/dL — ABNORMAL HIGH (ref 0–99)
Triglycerides: 153 mg/dL — ABNORMAL HIGH (ref 0–149)
VLDL Cholesterol Cal: 28 mg/dL (ref 5–40)

## 2021-06-19 LAB — HEPATIC FUNCTION PANEL
ALT: 27 IU/L (ref 0–32)
AST: 28 IU/L (ref 0–40)
Albumin: 4.6 g/dL (ref 3.8–4.9)
Alkaline Phosphatase: 63 IU/L (ref 44–121)
Bilirubin Total: 0.4 mg/dL (ref 0.0–1.2)
Bilirubin, Direct: 0.11 mg/dL (ref 0.00–0.40)
Total Protein: 7 g/dL (ref 6.0–8.5)

## 2021-07-09 NOTE — Progress Notes (Addendum)
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 with a hx of HTN, HLD, and CAD with small vessel disease.  She presented to [facility] on 1/22 with chief complaint of chest pain treated with nitroglycerin with negative troponin and normal ECG.  She was referred to Dr. Tamala Julian for coronary work-up with coronary CTA that revealed positive FFR lesion in mid/distal RCA.Cath performed 2/22 that showed 95% distal LAD (2 mm in diameter), 50% stenosed distal RCA, treated medically with aggressive risk factor modification included DAPT for 6 months.  She was seen by Dr. Tamala Julian post [place] on 3/22 and at that time Plavix was discontinued and patient instructed to follow lifestyle modifications.  She returned to the office 11/22 and was seen by Dr. Irish Lack with complaint angina that was relieved with sublingual nitroglycerin and aspirin. Isosorbide 15 mg and was added to CAD management. Her atorvastatin was increased from 20-40 mg.   She presents today for follow-up and reports that she has been doing well since her last office visit in November 2022.  She has retired since that appointment and  denies chest pain, shortness of breath, lower extremity edema, fatigue, palpitations, melena, hematuria, hemoptysis, diaphoresis, weakness, presyncope, syncope, orthopnea, and PND.  She has had compliance issues with atorvastatin due to myalgias. She reports that she is walking for exercise and is following a low-sodium heart healthy diet.    Past Medical History:  Diagnosis Date   GERD (gastroesophageal reflux disease)    Hypercholesteremia    Hypertension     Past Surgical History:  Procedure Laterality Date   ABDOMINAL  HYSTERECTOMY     LEFT HEART CATH AND CORONARY ANGIOGRAPHY N/A 07/10/2020   Procedure: LEFT HEART CATH AND CORONARY ANGIOGRAPHY;  Surgeon: Belva Crome, MD;  Location: [place] [facility];  Service: Cardiovascular;  Laterality: N/A;   VAGINAL DELIVERY      Current Medications: No outpatient medications have been marked as taking for the 07/10/21 encounter (Appointment) with Tempie Donning, NP.     Allergies:   Sulfa antibiotics and Chlorhexidine   Social History   Socioeconomic History   Marital status: Married    Spouse name: Not on file   Number of children: Not on file   Years of education: Not on file   Highest education level: Not on file  Occupational History   Not on file  Tobacco Use   Smoking status: Former    Types: Cigarettes    Quit date: 2003    Years since quitting: 20.1   Smokeless tobacco: Never  Vaping Use   Vaping Use: Never used  Substance and Sexual Activity   Alcohol use: Never   Drug use: Never   Sexual activity: Yes  Other Topics Concern   Not on file  Social History Narrative   Not on file   Social Determinants of Health   Financial Resource Strain: Not on file  Food Insecurity: Not on file  Transportation Needs: Not on file  Physical Activity: Not on file  Stress: Not on file  Social Connections: Not on file     Family  History: The patient's family history includes Diabetes in her mother; Heart attack in her father.  ROS:   Please see the history of present illness.    Review of Systems  Respiratory:  Negative for shortness of breath.   Cardiovascular:  Negative for chest pain, palpitations, orthopnea, claudication, leg swelling and PND.  Musculoskeletal:  Positive for myalgias.       Atorvastatin    All other systems reviewed and are negative.  EKGs/Labs/Other Studies Reviewed:    The following studies were reviewed today:   EKG:  EKG is not  ordered today.     Recent Labs: 06/18/2021: ALT 27  Recent Lipid Panel     Component Value Date/Time   CHOL 206 (H) 06/18/2021 0832   TRIG 153 (H) 06/18/2021 0832   HDL 39 (L) 06/18/2021 0832   CHOLHDL 3.5 09/04/2020 1100   LDLCALC 139 (H) 06/18/2021 0832     Risk Assessment/Calculations:           Physical Exam:    VS:  There were no vitals taken for this visit.    Wt Readings from Last 3 Encounters:  04/06/21 166 lb 6.4 oz (75.5 kg)  07/24/20 160 lb (72.6 kg)  07/10/20 159 lb (72.1 kg)      GEN:  Well nourished, well developed in no acute distress HEENT: Normal NECK: No JVD; No carotid bruits LYMPHATICS: No lymphadenopathy CARDIAC: RRR, no murmurs, rubs, gallops RESPIRATORY:  Clear to auscultation without rales, wheezing or rhonchi  ABDOMEN: Soft, non-tender, non-distended MUSCULOSKELETAL:  No edema; No deformity  SKIN: Warm and dry NEUROLOGIC:  Alert and oriented x 3 PSYCHIATRIC:  Normal affect    ASSESSMENT:    1. Coronary artery disease involving native coronary artery of native heart with other form of angina pectoris ([place])   2. Primary hypertension   3. Hyperlipidemia LDL goal <70    PLAN:    In order of problems listed above:  1.CAD: LHC-2/22 that showed 95% distal LAD (2 mm in diameter), 50% stenosed distal RCA, treated medically  -Continue Imdur 30 mg,  -stop Lipitor 40 mg and start Crestor 10 mg -Continue Toprol XL 25 mg, ASA 81 mg -Continue low-sodium heart healthy diet   2. Primary hypertension:  -B/P today: 104/76 well-controlled today continue exercise and walking and heart healthy low-sodium diet.   -Continue Toprol XL 25 mg     -Continue Zestoretic 10-12.5    3. Hyperlipidemia: We discussed the option of stopping Lipitor 40 mg and starting Crestor 10 mg.  She is in agreement with plan to recheck cholesterol in 3 months with liver function test.  Discussed the potential of lipid clinic referral if myalgias are worse on 10 mg Crestor.  -1/23 LDL (139), HDL (39) Trg (153) Total: 206 -Start Crestor 10 mg -Stop  atorvastatin 40 mg -Continue heart healthy diet -Exercise minimum 150 minutes/week   4.  Disposition: Follow up with Dr. Tamala Julian or APP in 6 months        Medication Adjustments/Labs and Tests Ordered: Current medicines are reviewed at length with the patient today.  Concerns regarding medicines are outlined above.  No orders of the defined types were placed in this encounter.  No orders of the defined types were placed in this encounter.   There are no Patient Instructions on file for this visit.   Signed, Mable Fill, Marissa Nestle, NP  07/10/2021 10:12 AM    [place]

## 2021-07-10 ENCOUNTER — Encounter: Payer: Self-pay | Admitting: Nurse Practitioner

## 2021-07-10 ENCOUNTER — Other Ambulatory Visit: Payer: Self-pay

## 2021-07-10 ENCOUNTER — Ambulatory Visit (INDEPENDENT_AMBULATORY_CARE_PROVIDER_SITE_OTHER): Admitting: Nurse Practitioner

## 2021-07-10 ENCOUNTER — Telehealth: Payer: Self-pay

## 2021-07-10 VITALS — BP 104/76 | HR 74 | Resp 16 | Ht 60.0 in | Wt 164.6 lb

## 2021-07-10 DIAGNOSIS — E785 Hyperlipidemia, unspecified: Secondary | ICD-10-CM | POA: Diagnosis not present

## 2021-07-10 DIAGNOSIS — I25118 Atherosclerotic heart disease of native coronary artery with other forms of angina pectoris: Secondary | ICD-10-CM | POA: Diagnosis not present

## 2021-07-10 DIAGNOSIS — I10 Essential (primary) hypertension: Secondary | ICD-10-CM | POA: Diagnosis not present

## 2021-07-10 MED ORDER — LISINOPRIL-HYDROCHLOROTHIAZIDE 10-12.5 MG PO TABS: 1.0000 | ORAL_TABLET | Freq: Every day | ORAL | 3 refills | Status: DC

## 2021-07-10 MED ORDER — ROSUVASTATIN CALCIUM 10 MG PO TABS: 10.0000 mg | ORAL_TABLET | Freq: Every day | ORAL | 3 refills | Status: DC

## 2021-07-10 NOTE — Patient Instructions (Signed)
Medication Instructions:   DISCONTINUE Atorvastatin  START Crestor one (1) tablet by mouth (10 mg) daily.   *If you need a refill on your cardiac medications before your next appointment, please call your pharmacy*   Follow-Up: At [facility], you and your health needs are our priority.  As part of our continuing mission to provide you with exceptional heart care, we have created designated Provider Care Teams.  These Care Teams include your primary Cardiologist (physician) and Advanced Practice Providers (APPs -  Physician Assistants and Nurse Practitioners) who all work together to provide you with the care you need, when you need it.  We recommend signing up for the patient portal called "MyChart".  Sign up information is provided on this After Visit Summary.  MyChart is used to connect with patients for Virtual Visits (Telemedicine).  Patients are able to view lab/test results, encounter notes, upcoming appointments, etc.  Non-urgent messages can be sent to your provider as well.   To learn more about what you can do with MyChart, go to [email or web address].    Your next appointment:   6 month(s)  The format for your next appointment:   In Person  Provider:   Sinclair Grooms, MD

## 2021-07-10 NOTE — Telephone Encounter (Signed)
Called PCP(Shah) and requested last labs done 01/2021. They will fax to [phone].

## 2021-08-01 ENCOUNTER — Ambulatory Visit
Admission: RE | Admit: 2021-08-01 | Discharge: 2021-08-01 | Disposition: A | Discharge status: Home / Self Care | Source: Ambulatory Visit | Attending: Emergency Medicine | Admitting: Emergency Medicine

## 2021-08-01 ENCOUNTER — Other Ambulatory Visit: Payer: Self-pay

## 2021-08-01 VITALS — BP 129/85 | HR 70 | Temp 99.3°F | Resp 20

## 2021-08-01 DIAGNOSIS — R300 Dysuria: Secondary | ICD-10-CM | POA: Insufficient documentation

## 2021-08-01 LAB — POCT URINALYSIS DIP (MANUAL ENTRY)
Bilirubin, UA: NEGATIVE
Blood, UA: NEGATIVE
Glucose, UA: NEGATIVE mg/dL
Ketones, POC UA: NEGATIVE mg/dL
Nitrite, UA: POSITIVE — AB
Protein Ur, POC: NEGATIVE mg/dL
Spec Grav, UA: 1.015 (ref 1.010–1.025)
Urobilinogen, UA: 0.2 E.U./dL
pH, UA: 6.5 (ref 5.0–8.0)

## 2021-08-01 MED ORDER — FOSFOMYCIN TROMETHAMINE 3 G PO PACK: 3.0000 g | PACK | Freq: Once | ORAL | 0 refills | Status: AC

## 2021-08-01 NOTE — ED Triage Notes (Signed)
Pt c/o burning while urinating and urinary urgency. She reports having pressure to pelvic region, she has taken AZO. ?

## 2021-08-01 NOTE — Discharge Instructions (Addendum)
The urinalysis that we performed in the clinic today was abnormal.  Urine culture will be performed per our protocol.  The result of the urine culture will be available in the next 3 to 5 days and will be posted to your MyChart account.  If there is an abnormal finding, you will be contacted by phone and advised of further treatment recommendations, if any. ?  ?You were advised to begin antibiotics today because you are having active symptoms of a urinary tract infection.  A prescription has been sent to your pharmacy. ?  ?If you have not had complete resolution of your symptoms after completing treatment as prescribed, please return to urgent care for repeat evaluation or follow-up with your primary care provider. ?  ?Thank you for visiting urgent care today.  We appreciate the opportunity to participate in your care. ? ?

## 2021-08-01 NOTE — ED Provider Notes (Signed)
?[facility] ? ? ? ?CSN: [id number] ?Arrival date & time: 08/01/21  1325 ?  ? ?HISTORY  ? ?Chief Complaint  ?Patient presents with  ? Urinary Frequency  ? ?HPI ?Lauren Mcneil is a 61 y.o. female. Pt c/o burning while urinating and urinary urgency for the past several days. She reports having pressure in her lower abdomen as well, states she has been taking Azo with little relief.  Patient denies history of frequent urinary tract infections.  Patient denies fever, vaginal discharge.  Urine dip today revealed nitrites. ? ? ?Past Medical History:  ?Diagnosis Date  ? GERD (gastroesophageal reflux disease)   ? Hypercholesteremia   ? Hypertension   ? ?Patient Active Problem List  ? Diagnosis Date Noted  ? Angina pectoris ([place]) 07/09/2020  ? CAD (coronary artery disease), native coronary artery 07/09/2020  ? Hyperlipidemia with target LDL less than 70 07/09/2020  ? Primary hypertension 07/09/2020  ? ?Past Surgical History:  ?Procedure Laterality Date  ? ABDOMINAL HYSTERECTOMY    ? LEFT HEART CATH AND CORONARY ANGIOGRAPHY N/A 07/10/2020  ? Procedure: LEFT HEART CATH AND CORONARY ANGIOGRAPHY;  Surgeon: Belva Crome, MD;  Location: [facility];  Service: Cardiovascular;  Laterality: N/A;  ? VAGINAL DELIVERY    ? ?OB History   ? ? Gravida  ?   ? Para  ?   ? Term  ?   ? Preterm  ?   ? AB  ?   ? Living  ?3  ?  ? ? SAB  ?   ? IAB  ?   ? Ectopic  ?   ? Multiple  ?   ? Live Births  ?   ?   ?  ?  ? ?Home Medications   ? ?Prior to Admission medications   ?Medication Sig Start Date End Date Taking? Authorizing Provider  ?isosorbide mononitrate (IMDUR) 30 MG 24 hr tablet Take 1 tablet (30 mg total) by mouth daily. 04/06/21   Jettie Booze, MD  ?lisinopril-hydrochlorothiazide (ZESTORETIC) 10-12.5 MG tablet Take 1 tablet by mouth daily. 07/10/21 07/10/22  Tempie Donning, NP  ?metoprolol succinate (TOPROL XL) 25 MG 24 hr tablet Take 1 tablet (25 mg total) by mouth daily. 07/10/20 07/10/21  Belva Crome, MD   ?nitroGLYCERIN (NITROSTAT) 0.4 MG SL tablet Place 1 tablet (0.4 mg total) under the tongue every 5 (five) minutes as needed for chest pain. 04/06/21   Jettie Booze, MD  ?omeprazole-sodium bicarbonate (ZEGERID) 40-1100 MG capsule Take 1 capsule by mouth daily. 04/11/20   [provider]  ?rosuvastatin (CRESTOR) 10 MG tablet Take 1 tablet (10 mg total) by mouth daily. 07/10/21 10/08/21  Tempie Donning, NP  ? ?Family History ?Family History  ?Problem Relation Age of Onset  ? Diabetes Mother   ? Heart attack Father   ? ?Social History ?Social History  ? ?Tobacco Use  ? Smoking status: Former  ?  Types: Cigarettes  ?  Quit date: 2003  ?  Years since quitting: 20.2  ? Smokeless tobacco: Never  ?Vaping Use  ? Vaping Use: Never used  ?Substance Use Topics  ? Alcohol use: Never  ? Drug use: Never  ? ?Allergies   ?Chlorhexidine and Sulfa antibiotics ? ?Review of Systems ?Review of Systems ?Pertinent findings noted in history of present illness.  ? ?Physical Exam ?Triage Vital Signs ?ED Triage Vitals  ?Enc Vitals Group  ?   BP 03/16/21 0827 (!) 147/82  ?  Pulse Rate 03/16/21 0827 72  ?   Resp 03/16/21 0827 18  ?   Temp 03/16/21 0827 98.3 ?F (36.8 ?C)  ?   Temp Source 03/16/21 0827 Oral  ?   SpO2 03/16/21 0827 98 %  ?   Weight --   ?   Height --   ?   Head Circumference --   ?   Peak Flow --   ?   Pain Score 03/16/21 0826 5  ?   Pain Loc --   ?   Pain Edu? --   ?   Excl. in [place]? --   ?No data found. ? ?Updated Vital Signs ?There were no vitals taken for this visit. ? ?Physical Exam ?Vitals and nursing note reviewed.  ?Constitutional:   ?   General: She is not in acute distress. ?   Appearance: Normal appearance. She is not ill-appearing.  ?HENT:  ?   Head: Normocephalic and atraumatic.  ?Eyes:  ?   General: Lids are normal.     ?   Right eye: No discharge.     ?   Left eye: No discharge.  ?   Extraocular Movements: Extraocular movements intact.  ?   Conjunctiva/sclera: Conjunctivae normal.  ?   Right eye: Right  conjunctiva is not injected.  ?   Left eye: Left conjunctiva is not injected.  ?Neck:  ?   Trachea: Trachea and phonation normal.  ?Cardiovascular:  ?   Rate and Rhythm: Normal rate and regular rhythm.  ?   Pulses: Normal pulses.  ?   Heart sounds: Normal heart sounds. No murmur heard. ?  No friction rub. No gallop.  ?Pulmonary:  ?   Effort: Pulmonary effort is normal. No accessory muscle usage, prolonged expiration or respiratory distress.  ?   Breath sounds: Normal breath sounds. No stridor, decreased air movement or transmitted upper airway sounds. No decreased breath sounds, wheezing, rhonchi or rales.  ?Chest:  ?   Chest wall: No tenderness.  ?Abdominal:  ?   General: Abdomen is flat. Bowel sounds are normal. There is no distension.  ?   Palpations: Abdomen is soft.  ?   Tenderness: There is abdominal tenderness in the suprapubic area. There is no right CVA tenderness or left CVA tenderness.  ?   Hernia: No hernia is present.  ?Musculoskeletal:     ?   General: Normal range of motion.  ?   Cervical back: Normal range of motion and neck supple. Normal range of motion.  ?Lymphadenopathy:  ?   Cervical: No cervical adenopathy.  ?Skin: ?   General: Skin is warm and dry.  ?   Findings: No erythema or rash.  ?Neurological:  ?   General: No focal deficit present.  ?   Mental Status: She is alert and oriented to person, place, and time.  ?Psychiatric:     ?   Mood and Affect: Mood normal.     ?   Behavior: Behavior normal.  ? ? ?Visual Acuity ?Right Eye Distance:   ?Left Eye Distance:   ?Bilateral Distance:   ? ?Right Eye Near:   ?Left Eye Near:    ?Bilateral Near:    ? ?UC Couse / Diagnostics / Procedures:  ?  ?EKG ? ?Radiology ?No results found. ? ?Procedures ?Procedures (including critical care time) ? ?UC Diagnoses / Final Clinical Impressions(s)   ?I have reviewed the triage vital signs and the nursing notes. ? ?Pertinent labs & imaging results that were  available during my care of the patient were reviewed by me  and considered in my medical decision making (see chart for details).   ? ?Final diagnoses:  ?Dysuria  ? ? ? ?Urine dip today was positive for nitrites and pyuria.  Urine culture will be performed per our protocol.   ?  ?Patient was advised to begin antibiotics now due to findings on urine dip. ? ?Patient advised that they will be contacted with results of the urine culture and that treatment will be provided as indicated based on results. ?  ?Return precautions advised. ? ?ED Prescriptions   ? ? Medication Sig Dispense Auth. Provider  ? fosfomycin (MONUROL) 3 g PACK Take 3 g by mouth once for 1 dose. 3 g Lynden Oxford Scales, PA-C  ? ?  ? ?PDMP not reviewed this encounter. ? ?Pending results:  ?Labs Reviewed  ?POCT URINALYSIS DIP (MANUAL ENTRY) - Abnormal; Notable for the following components:  ?    Result Value  ? Nitrite, UA Positive (*)   ? Leukocytes, UA Small (1+) (*)   ? All other components within normal limits  ?URINE CULTURE  ? ? ?Medications Ordered in UC: ?Medications - No data to display ? ?Disposition Upon Discharge:  ?Condition: stable for discharge home ? ?Patient presented with concern for an acute illness with associated systemic symptoms and significant discomfort requiring urgent management. In my opinion, this is a condition that a prudent lay person (someone who possesses an average knowledge of health and medicine) may potentially expect to result in complications if not addressed urgently such as respiratory distress, impairment of bodily function or dysfunction of bodily organs.  ? ?As such, the patient has been evaluated and assessed, work-up was performed and treatment was provided in alignment with urgent care protocols and evidence based medicine.  Patient/parent/caregiver has been advised that the patient may require follow up for further testing and/or treatment if the symptoms continue in spite of treatment, as clinically indicated and appropriate. ? ?Routine symptom specific,  illness specific and/or disease specific instructions were discussed with the patient and/or caregiver at length.  Prevention strategies for avoiding STD exposure were also discussed. ? ?The patient will follow up

## 2021-08-02 LAB — URINE CULTURE: Culture: NO GROWTH

## 2021-10-03 ENCOUNTER — Other Ambulatory Visit: Payer: Self-pay | Admitting: Interventional Cardiology

## 2022-01-01 NOTE — Progress Notes (Unsigned)
Cardiology Office Note:    Date:  01/02/2022   ID:  Lauren Mcneil, DOB [birth year], MRN [id number]  PCP:  Clinton Sawyer, MD  Cardiologist:  Lesleigh Noe, MD   Referring MD: Clinton Sawyer, MD   Chief Complaint  Patient presents with   Coronary Artery Disease   Hyperlipidemia    History of Present Illness:    Lauren Mcneil is a 61 y.o. female with a hx of  primary hypertension, hyperlipidemia, and CAD with angina. Coronary angiography with small severely diseased distal LAD.   He has not had recurrence of angina.  She denies dyspnea.  She does not need sublingual nitroglycerin.  No lower extremity swelling.  Retired since I last saw her.  Staying very physically active, camping, traveling, and a busy schedule taking care of her in-laws.  Past Medical History:  Diagnosis Date   GERD (gastroesophageal reflux disease)    Hypercholesteremia    Hypertension     Past Surgical History:  Procedure Laterality Date   ABDOMINAL HYSTERECTOMY     LEFT HEART CATH AND CORONARY ANGIOGRAPHY N/A 07/10/2020   Procedure: LEFT HEART CATH AND CORONARY ANGIOGRAPHY;  Surgeon: Lyn Records, MD;  Location: [facility];  Service: Cardiovascular;  Laterality: N/A;   VAGINAL DELIVERY      Current Medications: Current Meds  Medication Sig   isosorbide mononitrate (IMDUR) 30 MG 24 hr tablet TAKE 1 TABLET BY MOUTH EVERY DAY   lisinopril-hydrochlorothiazide (ZESTORETIC) 10-12.5 MG tablet Take 1 tablet by mouth daily.   nitroGLYCERIN (NITROSTAT) 0.4 MG SL tablet Place 1 tablet (0.4 mg total) under the tongue every 5 (five) minutes as needed for chest pain.   omeprazole-sodium bicarbonate (ZEGERID) 40-1100 MG capsule Take 1 capsule by mouth daily.     Allergies:   Chlorhexidine and Sulfa antibiotics   Social History   Socioeconomic History   Marital status: Married    Spouse name: Not on file   Number of children: Not on file   Years of education: Not on file   Highest education level:  Not on file  Occupational History   Not on file  Tobacco Use   Smoking status: Former    Types: Cigarettes    Quit date: 2003    Years since quitting: 20.6   Smokeless tobacco: Never  Vaping Use   Vaping Use: Never used  Substance and Sexual Activity   Alcohol use: Never   Drug use: Never   Sexual activity: Yes  Other Topics Concern   Not on file  Social History Narrative   Not on file   Social Determinants of Health   Financial Resource Strain: Not on file  Food Insecurity: Not on file  Transportation Needs: Not on file  Physical Activity: Not on file  Stress: Not on file  Social Connections: Not on file     Family History: The patient's family history includes Diabetes in her mother; Heart attack in her father.  ROS:   Please see the history of present illness.    Anxiety related to stress from taking care of her in-laws.  All other systems reviewed and are negative.  EKGs/Labs/Other Studies Reviewed:    The following studies were reviewed today: COR ANGIO 06/2020: Diagnostic Dominance: Right  Intervention   EKG:  EKG normal sinus rhythm, low voltage, overall normal.  Compared to November 2022, heart rate is faster.  Recent Labs: 06/18/2021: ALT 27  Recent Lipid Panel    Component  Value Date/Time   CHOL 206 (H) 06/18/2021 0832   TRIG 153 (H) 06/18/2021 0832   HDL 39 (L) 06/18/2021 0832   CHOLHDL 3.5 09/04/2020 1100   LDLCALC 139 (H) 06/18/2021 0832    Physical Exam:    VS:  BP (!) 132/92   Pulse 60   Ht 5' (1.524 m)   Wt 163 lb 9.6 oz (74.2 kg)   SpO2 99%   BMI 31.95 kg/m     Wt Readings from Last 3 Encounters:  01/02/22 163 lb 9.6 oz (74.2 kg)  07/10/21 164 lb 9.6 oz (74.7 kg)  04/06/21 166 lb 6.4 oz (75.5 kg)     GEN: Obesity. No acute distress HEENT: Normal NECK: No JVD. LYMPHATICS: No lymphadenopathy CARDIAC: No murmur. RRR no gallop, or edema. VASCULAR:  Normal Pulses. No bruits. RESPIRATORY:  Clear to auscultation without rales,  wheezing or rhonchi  ABDOMEN: Soft, non-tender, non-distended, No pulsatile mass, MUSCULOSKELETAL: No deformity  SKIN: Warm and dry NEUROLOGIC:  Alert and oriented x 3 PSYCHIATRIC:  Normal affect   ASSESSMENT:    1. Coronary artery disease involving native coronary artery of native heart with other form of angina pectoris (HCC)   2. Primary hypertension   3. Hyperlipidemia LDL goal <70   4. Precordial pain    PLAN:    In order of problems listed above:  Coated aspirin 81 mg/day Excellent blood pressure control on current therapy Liver and lipid panel within the next several weeks fasting. No recurrence of chest pain.   Overall education and awareness concerning primary/secondary risk prevention was discussed in detail: LDL less than 70, hemoglobin A1c less than 7, blood pressure target less than 130/80 mmHg, >150 minutes of moderate aerobic activity per week, avoidance of smoking, weight control (via diet and exercise), and continued surveillance/management of/for obstructive sleep apnea.  Medical follow-up in 1 year.   Medication Adjustments/Labs and Tests Ordered: Current medicines are reviewed at length with the patient today.  Concerns regarding medicines are outlined above.  No orders of the defined types were placed in this encounter.  No orders of the defined types were placed in this encounter.   There are no Patient Instructions on file for this visit.   Signed, Lesleigh Noe, MD  01/02/2022 12:09 PM    [facility]

## 2022-01-02 ENCOUNTER — Encounter: Payer: Self-pay | Admitting: Interventional Cardiology

## 2022-01-02 ENCOUNTER — Ambulatory Visit: Admitting: Interventional Cardiology

## 2022-01-02 VITALS — BP 132/92 | HR 60 | Ht 60.0 in | Wt 163.6 lb

## 2022-01-02 DIAGNOSIS — R072 Precordial pain: Secondary | ICD-10-CM | POA: Diagnosis not present

## 2022-01-02 DIAGNOSIS — E785 Hyperlipidemia, unspecified: Secondary | ICD-10-CM | POA: Diagnosis not present

## 2022-01-02 DIAGNOSIS — I25118 Atherosclerotic heart disease of native coronary artery with other forms of angina pectoris: Secondary | ICD-10-CM | POA: Diagnosis not present

## 2022-01-02 DIAGNOSIS — I10 Essential (primary) hypertension: Secondary | ICD-10-CM | POA: Diagnosis not present

## 2022-01-02 MED ORDER — METOPROLOL SUCCINATE ER 25 MG PO TB24: 25.0000 mg | ORAL_TABLET | Freq: Every day | ORAL | 3 refills | Status: DC

## 2022-01-02 MED ORDER — ASPIRIN 81 MG PO TBEC: 81.0000 mg | DELAYED_RELEASE_TABLET | Freq: Every day | ORAL | Status: AC

## 2022-01-02 NOTE — Patient Instructions (Signed)
Medication Instructions:  Your physician has recommended you make the following change in your medication:   1) START Aspirin (enteric coated) 81mg  daily  *If you need a refill on your cardiac medications before your next appointment, please call your pharmacy*  Lab Work: Within the next 2-4 weeks: fasting Lipid panel, CMET If you have labs (blood work) drawn today and your tests are completely normal, you will receive your results only by: MyChart Message (if you have MyChart) OR A paper copy in the mail If you have any lab test that is abnormal or we need to change your treatment, we will call you to review the results.  Testing/Procedures: NONE  Follow-Up: At [facility], you and your health needs are our priority.  As part of our continuing mission to provide you with exceptional heart care, we have created designated Provider Care Teams.  These Care Teams include your primary Cardiologist (physician) and Advanced Practice Providers (APPs -  Physician Assistants and Nurse Practitioners) who all work together to provide you with the care you need, when you need it.  Your next appointment:   1 year(s)  The format for your next appointment:   In Person  Provider:   [facility], MD {  Important Information About Sugar

## 2022-01-15 ENCOUNTER — Ambulatory Visit: Attending: Interventional Cardiology

## 2022-01-15 DIAGNOSIS — I10 Essential (primary) hypertension: Secondary | ICD-10-CM

## 2022-01-15 DIAGNOSIS — E785 Hyperlipidemia, unspecified: Secondary | ICD-10-CM

## 2022-01-15 LAB — LIPID PANEL
Chol/HDL Ratio: 4 ratio (ref 0.0–4.4)
Cholesterol, Total: 182 mg/dL (ref 100–199)
HDL: 46 mg/dL (ref >39)
LDL Chol Calc (NIH): 106 mg/dL — ABNORMAL HIGH (ref 0–99)
Triglycerides: 171 mg/dL — ABNORMAL HIGH (ref 0–149)
VLDL Cholesterol Cal: 30 mg/dL (ref 5–40)

## 2022-01-15 LAB — COMPREHENSIVE METABOLIC PANEL
ALT: 27 IU/L (ref 0–32)
AST: 21 IU/L (ref 0–40)
Albumin/Globulin Ratio: 2.2 (ref 1.2–2.2)
Albumin: 4.8 g/dL (ref 3.9–4.9)
Alkaline Phosphatase: 55 IU/L (ref 44–121)
BUN/Creatinine Ratio: 15 (ref 12–28)
BUN: 12 mg/dL (ref 8–27)
Bilirubin Total: 0.4 mg/dL (ref 0.0–1.2)
CO2: 23 mmol/L (ref 20–29)
Calcium: 10.2 mg/dL (ref 8.7–10.3)
Chloride: 97 mmol/L (ref 96–106)
Creatinine, Ser: 0.79 mg/dL (ref 0.57–1.00)
Globulin, Total: 2.2 g/dL (ref 1.5–4.5)
Glucose: 87 mg/dL (ref 70–99)
Potassium: 4.1 mmol/L (ref 3.5–5.2)
Sodium: 136 mmol/L (ref 134–144)
Total Protein: 7 g/dL (ref 6.0–8.5)
eGFR: 85 mL/min/{1.73_m2} (ref >59)

## 2022-03-27 ENCOUNTER — Encounter: Payer: Self-pay | Admitting: Interventional Cardiology

## 2022-03-27 ENCOUNTER — Telehealth: Payer: Self-pay | Admitting: Interventional Cardiology

## 2022-03-27 DIAGNOSIS — Z79899 Other long term (current) drug therapy: Secondary | ICD-10-CM

## 2022-03-27 DIAGNOSIS — E785 Hyperlipidemia, unspecified: Secondary | ICD-10-CM

## 2022-03-27 MED ORDER — ROSUVASTATIN CALCIUM 20 MG PO TABS: 20.0000 mg | ORAL_TABLET | Freq: Every day | ORAL | Status: DC

## 2022-03-27 NOTE — Telephone Encounter (Signed)
-----   Message from Lyn Records, MD sent at 01/18/2022  5:44 PM EDT ----- Let the patient know the levels are too high. LDL needs to be < 70. Either increase the rosuvastatin to 20 mg daily or add Zetia 20 mg daily. A copy will be sent to Patrcia Dolly, DO

## 2022-03-27 NOTE — Telephone Encounter (Signed)
Patient returning call for lab results. 

## 2022-03-27 NOTE — Telephone Encounter (Signed)
Returned call to patient.  Discuss lab results, per Dr. Katrinka Blazing: Let the patient know the levels are too high. LDL needs to be < 70. Either increase the rosuvastatin to 20 mg daily or add Zetia 20 mg daily.    Patient states she would like to increase Rosuvastatin to 20mg  QD. She just received a new refill of 10mg , she will take 2 tablets daily and call for refills when needed.  Fasting Lipids and LFTs ordered, lab scheduled for 05/21/22.  Patient verbalized understanding and expressed appreciation for call.

## 2022-04-21 IMAGING — DX DG CHEST 2V
2 series · 2 of 2 positions shown · non-contrast
Comparison: None.

CLINICAL DATA: Chest pain

EXAM:
CHEST - 2 VIEW

[chest pa]
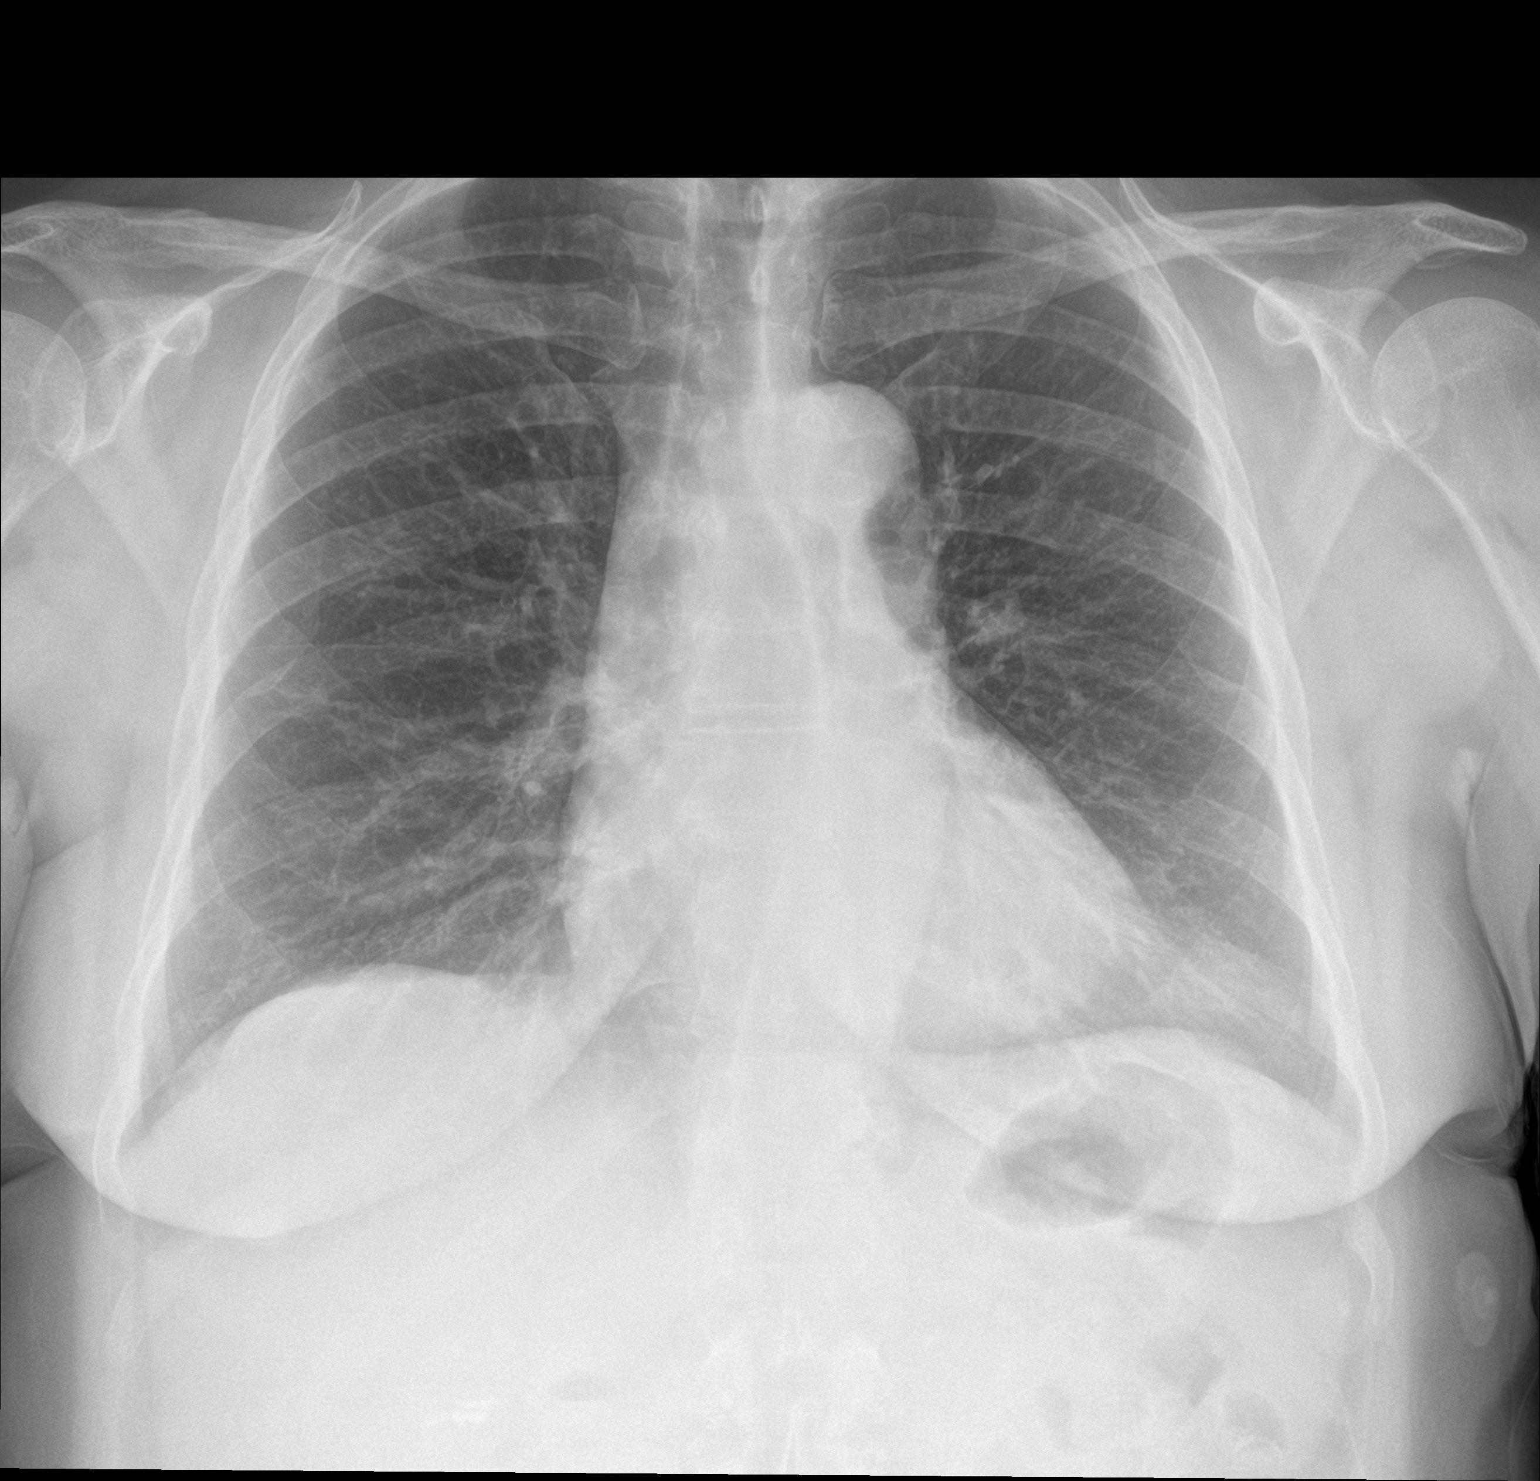

[chest lat]
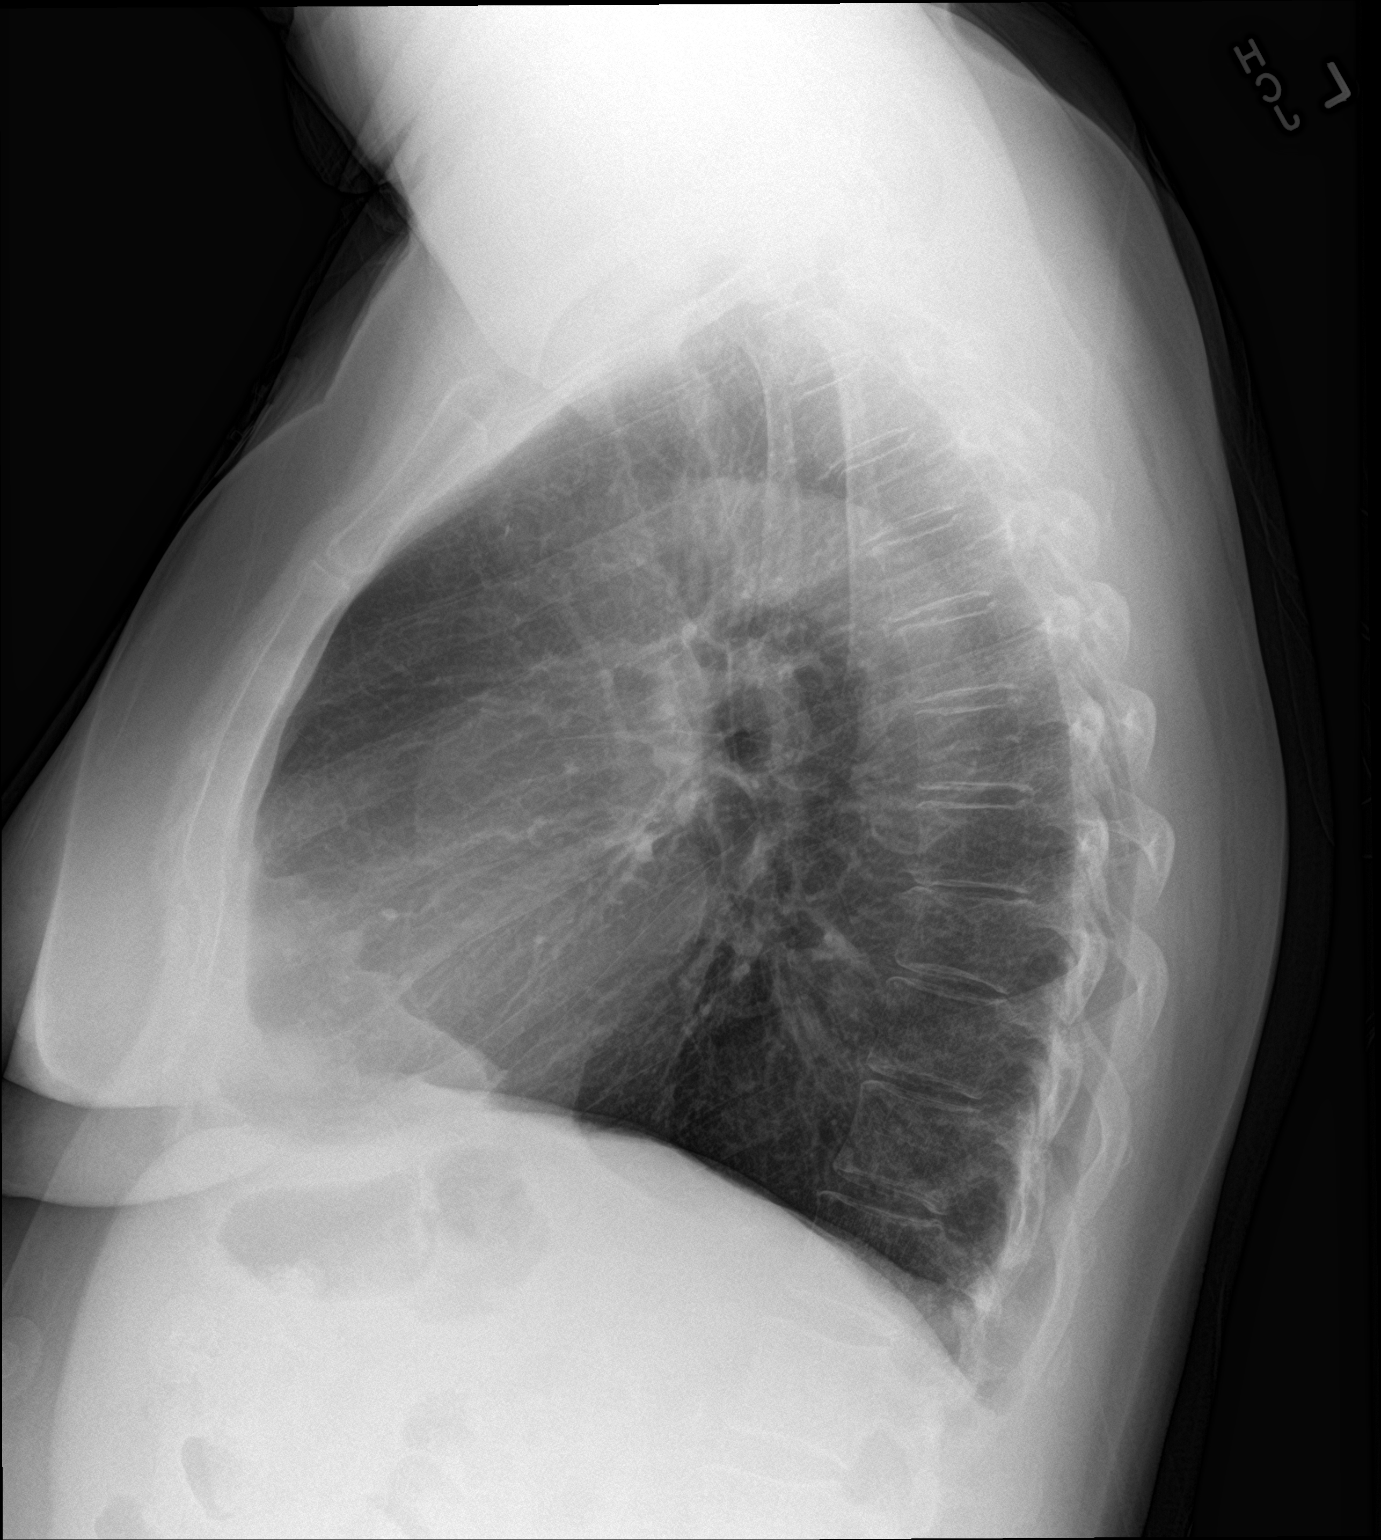

[2 of 2 positions shown; findings below may reference images not displayed]

FINDINGS: The heart size and mediastinal contours are within normal limits.
Both lungs are clear. The visualized skeletal structures are
unremarkable.
IMPRESSION: No active cardiopulmonary disease.

## 2022-05-01 IMAGING — CT CT HEART MORP W/ CTA COR W/ SCORE W/ CA W/CM &/OR W/O CM
4 of 7 series · 8 of 20 positions shown, 9 images · IV contrast (APPLIED)
Comparison: None.
COMPARISON: None.

Addendum:
EXAM:
OVER-READ INTERPRETATION  CT CHEST

The following report is an over-read performed by radiologist Dr.
over-read does not include interpretation of cardiac or coronary
anatomy or pathology. The coronary CTA interpretation by the
cardiologist is attached.
CLINICAL DATA: Chest pain
Cardiac CTA
MEDICATIONS:
Sub lingual nitro. 4 mg and lopressor mg
TECHNIQUE: The patient was scanned on a Siemens Force 192 scanner. Gantry
rotation speed was 250 msecs. Collimation was. 6 mm . A 120 kV
prospective scan was triggered in the ascending thoracic aorta at
140 HU's with full mA between 30-70% of the R-R interval . Average
HR during the scan was 68 bpm. The 3D data set was interpreted on a
dedicated work station using MPR, MIP and VRT modes. A total of 80
cc of contrast was used.

[Series 6: best diast 76 % · axial · 0.39mm/px · z∈[+1032,+1068]mm · 2 of 271 slices shown]
[im 91/271  vessel]
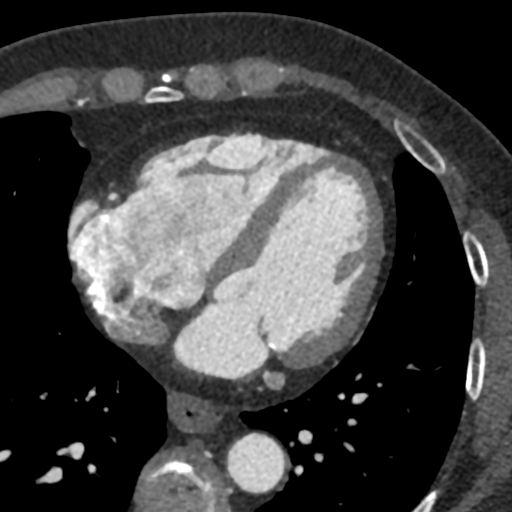
[im 181/271  vessel]
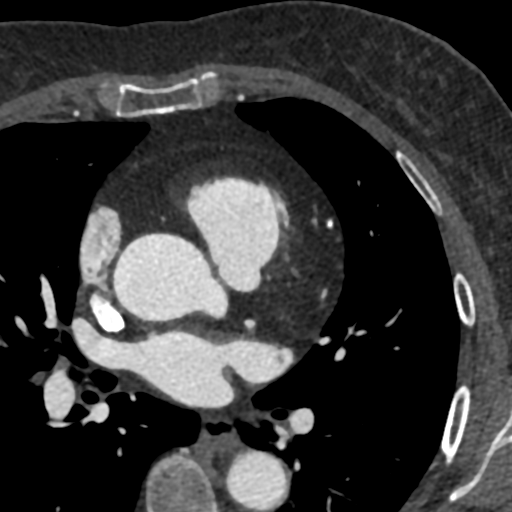

[Series 7: best syst · axial · 0.39mm/px · z∈[+1032,+1068]mm · 2 of 271 slices shown, 3 images]
[im 91/271  vessel]
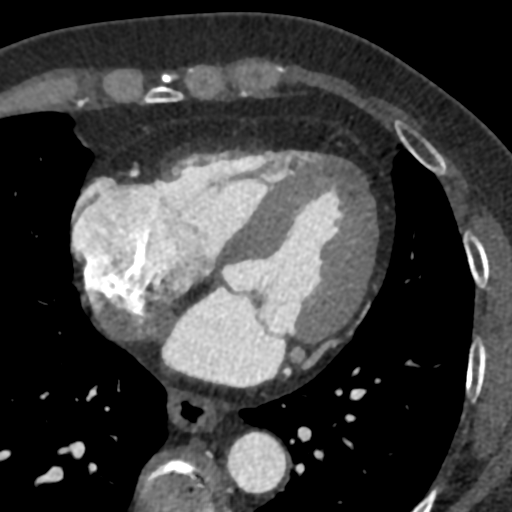
[im 91/271  lung]
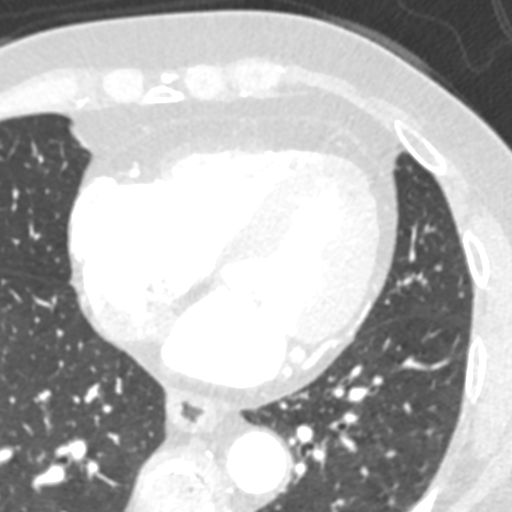
[im 181/271  vessel]
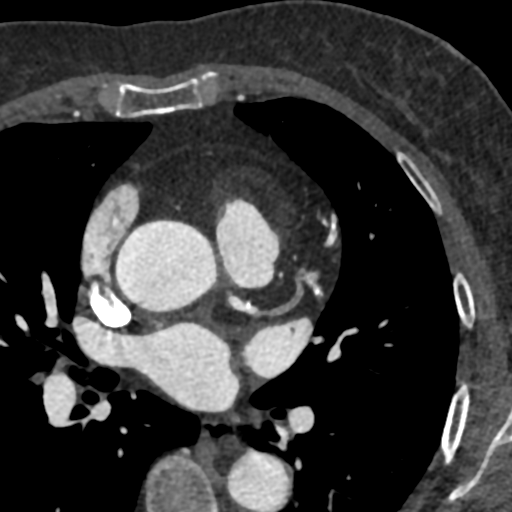

[Series 8: ts diast sharp 76 % · axial · 0.39mm/px · z∈[+1032,+1068]mm · 2 of 271 slices shown]
[im 91/271  lung]
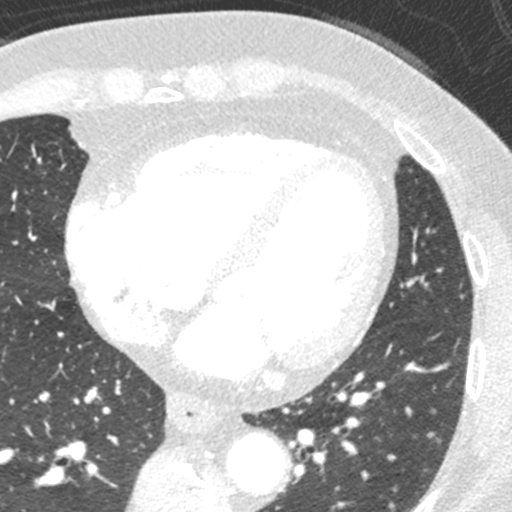
[im 181/271  lung]
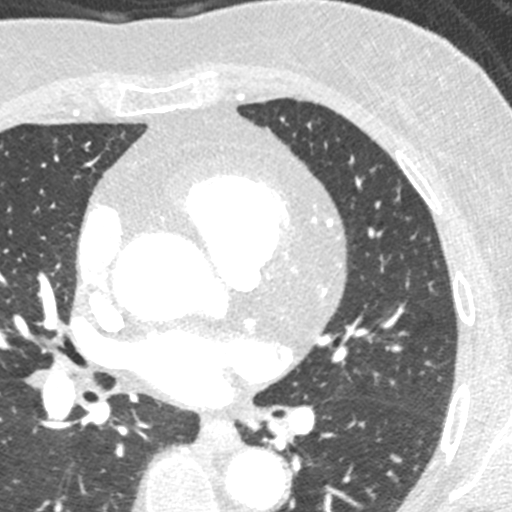

[Series 9: ts syst sharp · axial · 0.39mm/px · z∈[+1032,+1068]mm · 2 of 271 slices shown]
[im 91/271  lung]
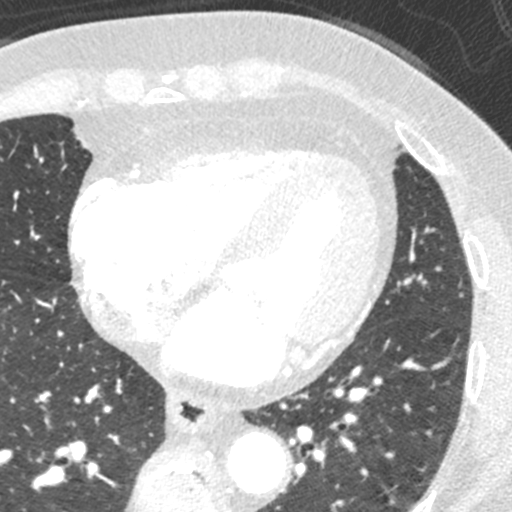
[im 181/271  lung]
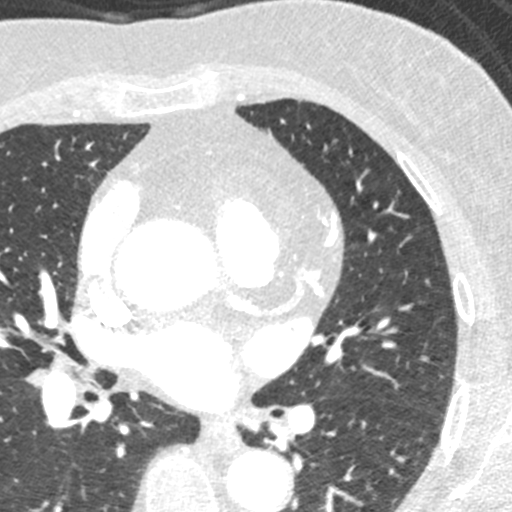

[8 of 20 positions shown; findings below may reference images not displayed]

FINDINGS: Vascular: No significant vascular findings. Normal heart size. No
pericardial effusion.

Mediastinum/Nodes: Large hiatal hernia. No visualized
lymphadenopathy.

Lungs/Pleura: Visualized lungs show no evidence of pulmonary edema,
consolidation, pneumothorax, nodule or pleural fluid.

Upper Abdomen: No acute abnormality.

Musculoskeletal: No chest wall mass or suspicious bone lesions
identified.
IMPRESSION: Large hiatal hernia.
FINDINGS: Non-cardiac: See separate report from [REDACTED]. No
significant findings on limited lung and soft tissue windows.

Calcium score: Marked 3 vessel calcification

Coronary Arteries: Right dominant with no anomalies

LM: 1-24% calcific disease

LAD: Severely calcified mid LAD with poor distal flow 70-99%
calcific plaque starting at take off of D1 Cannot r/o subtotal
occlusion

D1: 60-79% calcified proximal lesion

D2: Small vessel not well seen

Circumflex: 1-24% calcified plaque in proximal and mid vessel

OM1: Normal

OM2: Normal

RCA: Marked calcification of the entire RCA 50-69% stenosis in
ostium, proximal, mid and distal vessel.

PDA: 40-59% calcific plaque proximally and distally

PLA: Normal
IMPRESSION: 1. Calcium score 2828 involving all 3 major epicardial coronary
arteries This is 99 th percentile for age and sex

2.  Normal aortic root with atherosclerosis diameter 3.7 cm

3. CAD RADS 4 Likely hemodynamically significant disease in RCA and
LAD Study sent for FFR CT

Ricklesh Molikula

*** End of Addendum ***
EXAM:
OVER-READ INTERPRETATION  CT CHEST

The following report is an over-read performed by radiologist Dr.
over-read does not include interpretation of cardiac or coronary
anatomy or pathology. The coronary CTA interpretation by the
cardiologist is attached.
FINDINGS: Vascular: No significant vascular findings. Normal heart size. No
pericardial effusion.

Mediastinum/Nodes: Large hiatal hernia. No visualized
lymphadenopathy.

Lungs/Pleura: Visualized lungs show no evidence of pulmonary edema,
consolidation, pneumothorax, nodule or pleural fluid.

Upper Abdomen: No acute abnormality.

Musculoskeletal: No chest wall mass or suspicious bone lesions
identified.
IMPRESSION: Large hiatal hernia.

## 2022-05-21 ENCOUNTER — Ambulatory Visit: Attending: Interventional Cardiology

## 2022-05-21 DIAGNOSIS — Z79899 Other long term (current) drug therapy: Secondary | ICD-10-CM

## 2022-05-21 DIAGNOSIS — E785 Hyperlipidemia, unspecified: Secondary | ICD-10-CM

## 2022-05-21 LAB — HEPATIC FUNCTION PANEL
ALT: 25 IU/L (ref 0–32)
AST: 20 IU/L (ref 0–40)
Albumin: 4.5 g/dL (ref 3.9–4.9)
Alkaline Phosphatase: 54 IU/L (ref 44–121)
Bilirubin Total: 0.3 mg/dL (ref 0.0–1.2)
Bilirubin, Direct: <0.1 mg/dL (ref 0.00–0.40)
Total Protein: 6.5 g/dL (ref 6.0–8.5)

## 2022-05-21 LAB — LIPID PANEL
Chol/HDL Ratio: 4.1 ratio (ref 0.0–4.4)
Cholesterol, Total: 169 mg/dL (ref 100–199)
HDL: 41 mg/dL (ref >39)
LDL Chol Calc (NIH): 101 mg/dL — ABNORMAL HIGH (ref 0–99)
Triglycerides: 155 mg/dL — ABNORMAL HIGH (ref 0–149)
VLDL Cholesterol Cal: 27 mg/dL (ref 5–40)

## 2022-05-22 ENCOUNTER — Other Ambulatory Visit: Payer: Self-pay | Admitting: Interventional Cardiology

## 2022-05-22 ENCOUNTER — Telehealth: Payer: Self-pay | Admitting: Interventional Cardiology

## 2022-05-22 DIAGNOSIS — E785 Hyperlipidemia, unspecified: Secondary | ICD-10-CM

## 2022-05-22 MED ORDER — ROSUVASTATIN CALCIUM 40 MG PO TABS: 40.0000 mg | ORAL_TABLET | Freq: Every day | ORAL | 3 refills | Status: DC

## 2022-05-22 NOTE — Telephone Encounter (Signed)
Discussed lab results with patient.  Per Dr. Tamala Julian: Let the patient know the LDL is not at target. Please increase Rosuvastatin to 40 mg daily. Rpeat lipid panel in 1 month and have f/u in [facility] lipid clinic.   Rosuvastatin 40mg  QD sent to [facility].  Lipid panel ordered, needs to be scheduled around 06/25/2022, patient requests lab appt be scheduled on same day as appt with Pharm D for Lipid Clinic.  Patient verbalized understanding of the above and expressed appreciation for call.

## 2022-05-22 NOTE — Telephone Encounter (Signed)
Pt is returning call in regards to results. Transferred to [employer].

## 2022-05-22 NOTE — Progress Notes (Signed)
Referral to Lipid Clinic per Dr. Smith. 

## 2022-06-17 ENCOUNTER — Other Ambulatory Visit: Payer: Self-pay | Admitting: Nurse Practitioner

## 2022-06-25 ENCOUNTER — Other Ambulatory Visit

## 2022-07-04 ENCOUNTER — Telehealth: Payer: Self-pay | Admitting: Pharmacist

## 2022-07-04 ENCOUNTER — Ambulatory Visit (HOSPITAL_COMMUNITY)
Admission: RE | Admit: 2022-07-04 | Discharge: 2022-07-04 | Disposition: A | Discharge status: Home / Self Care | Source: Ambulatory Visit | Attending: Emergency Medicine | Admitting: Emergency Medicine

## 2022-07-04 ENCOUNTER — Other Ambulatory Visit

## 2022-07-04 ENCOUNTER — Encounter (HOSPITAL_COMMUNITY): Payer: Self-pay

## 2022-07-04 ENCOUNTER — Ambulatory Visit: Attending: Internal Medicine | Admitting: Pharmacist

## 2022-07-04 VITALS — BP 126/86 | HR 69 | Temp 98.1°F | Resp 16

## 2022-07-04 DIAGNOSIS — N3001 Acute cystitis with hematuria: Secondary | ICD-10-CM | POA: Diagnosis not present

## 2022-07-04 DIAGNOSIS — E785 Hyperlipidemia, unspecified: Secondary | ICD-10-CM

## 2022-07-04 LAB — POCT URINALYSIS DIPSTICK, ED / UC
Bilirubin Urine: NEGATIVE
Glucose, UA: NEGATIVE mg/dL
Ketones, ur: NEGATIVE mg/dL
Nitrite: POSITIVE — AB
Protein, ur: NEGATIVE mg/dL
Specific Gravity, Urine: 1.01 (ref 1.005–1.030)
Urobilinogen, UA: 0.2 mg/dL (ref 0.0–1.0)
pH: 6 (ref 5.0–8.0)

## 2022-07-04 LAB — LIPID PANEL
Chol/HDL Ratio: 3.5 ratio (ref 0.0–4.4)
Cholesterol, Total: 145 mg/dL (ref 100–199)
HDL: 41 mg/dL (ref >39)
LDL Chol Calc (NIH): 80 mg/dL (ref 0–99)
Triglycerides: 137 mg/dL (ref 0–149)
VLDL Cholesterol Cal: 24 mg/dL (ref 5–40)

## 2022-07-04 MED ORDER — REPATHA SURECLICK 140 MG/ML ~~LOC~~ SOAJ: 1.0000 mL | SUBCUTANEOUS | 3 refills | Status: DC

## 2022-07-04 MED ORDER — CIPROFLOXACIN HCL 250 MG PO TABS: 250.0000 mg | ORAL_TABLET | Freq: Two times a day (BID) | ORAL | 0 refills | Status: AC

## 2022-07-04 NOTE — Discharge Instructions (Signed)
Common causes of urinary tract infections include but are not limited to holding your urine longer than you should, squatting instead of sitting down when urinating, sitting around in wet clothing such as a wet swimsuit or gym clothes too long, not emptying your bladder after having sexual intercourse, wiping from back to front instead of front to back after having a bowel movement.     Less common causes of urinary tract infections include but are not limited to anatomical shifts in the location of your bladder or uterus causing obstruction of passage of urine from your bladder to your urethra where your urine comes out or prolapse of your rectum into your vaginal wall.  These less common causes can be evaluated by gynecologist, a urologist or subspecialist called a uro-gynecologist   The urinalysis that we performed in the clinic today was abnormal.   You were advised to begin antibiotics today because your urinalysis is abnormal and you are having active symptoms of an acute lower urinary tract infection also known as cystitis.     Please pick up and begin taking your prescription for Ciprofloxacin 250 mg as soon as possible.  Please take all doses exactly as prescribed.  You can take this medication with or without food.  This medication is safe to take with your other medications.   If you have not had complete resolution of your symptoms after completing treatment as prescribed, please return to urgent care for repeat evaluation or follow-up with your primary care provider.  Repeat urinalysis and urine culture may be indicated for more directed therapy.   Thank you for visiting urgent care today.  I appreciate the opportunity to participate in your care.

## 2022-07-04 NOTE — Assessment & Plan Note (Signed)
Assessment: Last LDL-C of 101 is above goal of <70. I would actually consider a more aggressive goal of <55 due to her age and family hx Tolerating rosuvastatin ok Did not tolerate atorvastatin w/ and without zetia Exercises on a regular basis No strength training Discussed PCSK9i and benefits of being aggressive  Plan: Will submit a PA for Repatha Pt has [employer] for life therefore cost would be $38/90 days at ES Continue rosuvastatin 36m daily Add in resistance training Check labs in 2-3 months

## 2022-07-04 NOTE — Progress Notes (Signed)
Patient ID: Lauren Mcneil                 DOB: 1961-04-18                    MRN: [id number]      HPI: Lauren Mcneil is a 62 y.o. female patient referred to lipid clinic by Dr. Tamala Julian. PMH is significant for primary hypertension, hyperlipidemia, and CAD with angina. Coronary angiography with small severely diseased distal LAD.  Patients rosuvastatin was increased to 20m in Nov 2023 and 447min Jan. LDL-C in Jan was 101. Lipid panel collected this AM.  Patient presents today to lipid clinic. 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 She did not tolerate atorvastatin or ezetimibe. Doing ok on rosuvastatin. Asks about taking COQ10. She does walk or hike daily. Has been cleaning out her house. No strength training. Has Tricare for life.   Reviewed options for lowering LDL cholesterol, PCSK-9 inhibitors. Discussed mechanisms of action, dosing, side effects and potential decreases in LDL cholesterol.  Also reviewed cost information.  Current Medications: rosuvastatin 4041maily Intolerances: atorvastatin, ezetimibe Risk Factors: severely disease LAD, strong family hx LDL-C goal: <70 (could consider <55) ApoB goal: <80 (could consider <70 since pt is <65  Diet: tries to eat well the majority of the time  Exercise: hikes or walks daily  Family History:  Family History  Problem Relation Age of Onset   Diabetes Mother    Heart attack Father      Social History:   Labs: Lipid Panel     Component Value Date/Time   CHOL 169 05/21/2022 0723   TRIG 155 (H) 05/21/2022 0723   HDL 41 05/21/2022 0723   CHOLHDL 4.1 05/21/2022 0723   LDLCALC 101 (H) 05/21/2022 0723   LABVLDL 27 05/21/2022 0723    Past Medical History:  Diagnosis Date   GERD (gastroesophageal reflux disease)    Hypercholesteremia    Hypertension     Current Outpatient Medications on File Prior to Visit  Medication Sig Dispense Refill   aspirin EC 81 MG tablet Take 1 tablet (81 mg total) by mouth  daily. Swallow whole.     isosorbide mononitrate (IMDUR) 30 MG 24 hr tablet TAKE 1 TABLET BY MOUTH EVERY DAY 90 tablet 0   lisinopril-hydrochlorothiazide (ZESTORETIC) 10-12.5 MG tablet TAKE 1 TABLET DAILY 90 tablet 3   metoprolol succinate (TOPROL XL) 25 MG 24 hr tablet Take 1 tablet (25 mg total) by mouth daily. 90 tablet 3   nitroGLYCERIN (NITROSTAT) 0.4 MG SL tablet Place 1 tablet (0.4 mg total) under the tongue every 5 (five) minutes as needed for chest pain. 25 tablet 6   omeprazole-sodium bicarbonate (ZEGERID) 40-1100 MG capsule Take 1 capsule by mouth daily.     rosuvastatin (CRESTOR) 40 MG tablet Take 1 tablet (40 mg total) by mouth daily. 90 tablet 3   No current facility-administered medications on file prior to visit.    Allergies  Allergen Reactions   Chlorhexidine Rash   Sulfa Antibiotics Other (See Comments)    Burning sensation     Assessment/Plan:  1. Hyperlipidemia -  Hyperlipidemia with target LDL less than 70 Assessment: Last LDL-C of 101 is above goal of <70. I would actually consider a more aggressive goal of <55 due to her age and family hx Tolerating rosuvastatin ok Did not tolerate atorvastatin w/ and without zetia Exercises on a regular basis No strength training Discussed  PCSK9i and benefits of being aggressive  Plan: Will submit a PA for Repatha Pt has Tricare for life therefore cost would be $38/90 days at ES Continue rosuvastatin 71m daily Add in resistance training Check labs in 2-3 months    Thank you,  MRamond Dial Pharm.D, BCPS, CPP [id number] [place] [address] Phone: [phone] Fax: [phone]

## 2022-07-04 NOTE — ED Triage Notes (Signed)
Pt states urinary frequency and foul smelling urine for over a week.

## 2022-07-04 NOTE — Telephone Encounter (Signed)
PA for Repatha submitted and approved Key: [facility] Rx sent to [facility] Patient made aware via mychart Will need labs in 2-3 months

## 2022-07-04 NOTE — ED Provider Notes (Signed)
[place]    CSN: [id number] Arrival date & time: 07/04/22  1126    HISTORY   Chief Complaint  Patient presents with   Urinary Frequency    Uti, foul smelling urine, burning,  kidney stone history - Entered by patient   HPI Lauren Mcneil is a pleasant, 62 y.o. female who presents to urgent care today. Patient complains of abnormal odor of urine, burning with urination, increased frequency of urination, and increased urge to urinate for the past 1 day.  Patient denies blood in urine, sensation of incomplete emptying, suprapubic pain, perineal pain, flank pain, fever, chills, malaise, rigors, significant fatigue, and abnormal vaginal discharge.  Patient reports a history of kidney stones.     Past Medical History:  Diagnosis Date   GERD (gastroesophageal reflux disease)    Hypercholesteremia    Hypertension    Patient Active Problem List   Diagnosis Date Noted   Angina pectoris ([place]) 07/09/2020   CAD (coronary artery disease), native coronary artery 07/09/2020   Hyperlipidemia with target LDL less than 70 07/09/2020   Primary hypertension 07/09/2020   Past Surgical History:  Procedure Laterality Date   ABDOMINAL HYSTERECTOMY     LEFT HEART CATH AND CORONARY ANGIOGRAPHY N/A 07/10/2020   Procedure: LEFT HEART CATH AND CORONARY ANGIOGRAPHY;  Surgeon: Belva Crome, MD;  Location: [facility];  Service: Cardiovascular;  Laterality: N/A;   VAGINAL DELIVERY     OB History     Gravida      Para      Term      Preterm      AB      Living  3      SAB      IAB      Ectopic      Multiple      Live Births             Home Medications    Prior to Admission medications   Medication Sig Start Date End Date Taking? Authorizing Provider  aspirin EC 81 MG tablet Take 1 tablet (81 mg total) by mouth daily. Swallow whole. 01/02/22   Belva Crome, MD  isosorbide mononitrate (IMDUR) 30 MG 24 hr tablet TAKE 1 TABLET BY MOUTH EVERY DAY 10/03/21    Marylu Lund., NP  lisinopril-hydrochlorothiazide (ZESTORETIC) 10-12.5 MG tablet TAKE 1 TABLET DAILY 06/17/22   Marylu Lund., NP  metoprolol succinate (TOPROL XL) 25 MG 24 hr tablet Take 1 tablet (25 mg total) by mouth daily. 01/02/22 01/02/23  Belva Crome, MD  nitroGLYCERIN (NITROSTAT) 0.4 MG SL tablet Place 1 tablet (0.4 mg total) under the tongue every 5 (five) minutes as needed for chest pain. 04/06/21   Jettie Booze, MD  omeprazole-sodium bicarbonate (ZEGERID) 40-1100 MG capsule Take 1 capsule by mouth daily. 04/11/20   [provider]  rosuvastatin (CRESTOR) 40 MG tablet Take 1 tablet (40 mg total) by mouth daily. 05/22/22   Belva Crome, MD    Family History Family History  Problem Relation Age of Onset   Diabetes Mother    Heart attack Father    Social History Social History   Tobacco Use   Smoking status: Former    Types: Cigarettes    Quit date: 2003    Years since quitting: 21.1   Smokeless tobacco: Never  Vaping Use   Vaping Use: Never used  Substance Use Topics   Alcohol use: Never  Drug use: Never   Allergies   Chlorhexidine and Sulfa antibiotics  Review of Systems Review of Systems Pertinent findings revealed after performing a 14 point review of systems has been noted in the history of present illness.  Physical Exam Vital Signs BP 126/86 (BP Location: Left Arm)   Pulse 69   Temp 98.1 F (36.7 C) (Oral)   Resp 16   SpO2 97%   No data found.  Physical Exam Vitals and nursing note reviewed.  Constitutional:      General: She is not in acute distress.    Appearance: Normal appearance. She is not ill-appearing.  HENT:     Head: Normocephalic and atraumatic.  Eyes:     General: Lids are normal.        Right eye: No discharge.        Left eye: No discharge.     Extraocular Movements: Extraocular movements intact.     Conjunctiva/sclera: Conjunctivae normal.     Right eye: Right conjunctiva is not injected.     Left  eye: Left conjunctiva is not injected.  Neck:     Trachea: Trachea and phonation normal.  Cardiovascular:     Rate and Rhythm: Normal rate and regular rhythm.     Pulses: Normal pulses.     Heart sounds: Normal heart sounds. No murmur heard.    No friction rub. No gallop.  Pulmonary:     Effort: Pulmonary effort is normal. No accessory muscle usage, prolonged expiration or respiratory distress.     Breath sounds: Normal breath sounds. No stridor, decreased air movement or transmitted upper airway sounds. No decreased breath sounds, wheezing, rhonchi or rales.  Chest:     Chest wall: No tenderness.  Abdominal:     General: Abdomen is flat. Bowel sounds are normal. There is no distension.     Palpations: Abdomen is soft.     Tenderness: There is no abdominal tenderness. There is no right CVA tenderness or left CVA tenderness.     Hernia: No hernia is present.  Musculoskeletal:        General: Normal range of motion.     Cervical back: Normal range of motion and neck supple. Normal range of motion.  Lymphadenopathy:     Cervical: No cervical adenopathy.  Skin:    General: Skin is warm and dry.     Findings: No erythema or rash.  Neurological:     General: No focal deficit present.     Mental Status: She is alert and oriented to person, place, and time.  Psychiatric:        Mood and Affect: Mood normal.        Behavior: Behavior normal.     Visual Acuity Right Eye Distance:   Left Eye Distance:   Bilateral Distance:    Right Eye Near:   Left Eye Near:    Bilateral Near:     UC Couse / Diagnostics / Procedures:     Radiology No results found.  Procedures Procedures (including critical care time) EKG  Pending results:  Labs Reviewed  POCT URINALYSIS DIPSTICK, ED / UC - Abnormal; Notable for the following components:      Result Value   Hgb urine dipstick TRACE (*)    Nitrite POSITIVE (*)    Leukocytes,Ua SMALL (*)    All other components within normal limits     Medications Ordered in UC: Medications - No data to display  UC Diagnoses / Final Clinical Impressions(s)  I have reviewed the triage vital signs and the nursing notes.  Pertinent labs & imaging results that were available during my care of the patient were reviewed by me and considered in my medical decision making (see chart for details).    Final diagnoses:  Acute cystitis with hematuria   Urine dip today was positive.   Patient was advised to begin antibiotics now due to findings on urine dip. Patient was advised to begin antibiotics today due to having active symptoms of urinary tract infection.                    Patient was advised to take all doses exactly as prescribed.  Patient also advised of risks of worsening infection with incomplete antibiotic therapy. Return precautions advised.  Please see discharge instructions below for details of plan of care as provided to patient. ED Prescriptions     Medication Sig Dispense Auth. Provider   ciprofloxacin (CIPRO) 250 MG tablet Take 1 tablet (250 mg total) by mouth 2 (two) times daily for 5 days. 10 tablet Lynden Oxford Scales, PA-C      PDMP not reviewed this encounter.  Disposition Upon Discharge:  Condition: stable for discharge home  Patient presented with concern for an acute illness with associated systemic symptoms and significant discomfort requiring urgent management. In my opinion, this is a condition that a prudent lay person (someone who possesses an average knowledge of health and medicine) may potentially expect to result in complications if not addressed urgently such as respiratory distress, impairment of bodily function or dysfunction of bodily organs.   As such, the patient has been evaluated and assessed, work-up was performed and treatment was provided in alignment with urgent care protocols and evidence based medicine.  Patient/parent/caregiver has been advised that the patient may require follow up  for further testing and/or treatment if the symptoms continue in spite of treatment, as clinically indicated and appropriate.  Routine symptom specific, illness specific and/or disease specific instructions were discussed with the patient and/or caregiver at length.  Prevention strategies for avoiding STD exposure were also discussed.  The patient will follow up with their current PCP if and as advised. If the patient does not currently have a PCP we will assist them in obtaining one.   The patient may need specialty follow up if the symptoms continue, in spite of conservative treatment and management, for further workup, evaluation, consultation and treatment as clinically indicated and appropriate.  Patient/parent/caregiver verbalized understanding and agreement of plan as discussed.  All questions were addressed during visit.  Please see discharge instructions below for further details of plan.  Discharge Instructions:   Discharge Instructions      Common causes of urinary tract infections include but are not limited to holding your urine longer than you should, squatting instead of sitting down when urinating, sitting around in wet clothing such as a wet swimsuit or gym clothes too long, not emptying your bladder after having sexual intercourse, wiping from back to front instead of front to back after having a bowel movement.     Less common causes of urinary tract infections include but are not limited to anatomical shifts in the location of your bladder or uterus causing obstruction of passage of urine from your bladder to your urethra where your urine comes out or prolapse of your rectum into your vaginal wall.  These less common causes can be evaluated by gynecologist, a urologist or subspecialist called a uro-gynecologist  The urinalysis that we performed in the clinic today was abnormal.   You were advised to begin antibiotics today because your urinalysis is abnormal and you are  having active symptoms of an acute lower urinary tract infection also known as cystitis.     Please pick up and begin taking your prescription for Ciprofloxacin 250 mg as soon as possible.  Please take all doses exactly as prescribed.  You can take this medication with or without food.  This medication is safe to take with your other medications.   If you have not had complete resolution of your symptoms after completing treatment as prescribed, please return to urgent care for repeat evaluation or follow-up with your primary care provider.  Repeat urinalysis and urine culture may be indicated for more directed therapy.   Thank you for visiting urgent care today.  I appreciate the opportunity to participate in your care.        This office note has been dictated using Museum/gallery curator.  Unfortunately, this method of dictation can sometimes lead to typographical or grammatical errors.  I apologize for your inconvenience in advance if this occurs.  Please do not hesitate to reach out to me if clarification is needed.       Lynden Oxford Scales, PA-C 07/04/22 1330

## 2022-07-04 NOTE — Patient Instructions (Signed)
I will submit a prior authorization for Repatha. I will call you once I hear back. Please call me at [phone] with any questions.   Repatha is a cholesterol medication that improved your body's ability to get rid of "bad cholesterol" known as LDL. It can lower your LDL up to 60%! It is an injection that is given under the skin every 2 weeks. The medication often requires a prior authorization from your insurance company. We will take care of submitting all the necessary information to your insurance company to get it approved. The most common side effects of Repatha include runny nose, symptoms of the common cold, rarely flu or flu-like symptoms, back/muscle pain in about 3-4% of the patients, and redness, pain, or bruising at the injection site. Tell your healthcare provider if you have any side effect that bothers you or that does not go away.   Continue taking rosuvastatin 18m daily

## 2022-09-16 ENCOUNTER — Encounter: Payer: Self-pay | Admitting: Pharmacist

## 2022-10-17 ENCOUNTER — Ambulatory Visit
Admission: RE | Admit: 2022-10-17 | Discharge: 2022-10-17 | Disposition: A | Discharge status: Home / Self Care | Source: Ambulatory Visit | Attending: Urgent Care | Admitting: Urgent Care

## 2022-10-17 ENCOUNTER — Ambulatory Visit (INDEPENDENT_AMBULATORY_CARE_PROVIDER_SITE_OTHER)

## 2022-10-17 VITALS — BP 158/94 | HR 71 | Temp 99.1°F | Resp 20

## 2022-10-17 DIAGNOSIS — M542 Cervicalgia: Secondary | ICD-10-CM

## 2022-10-17 DIAGNOSIS — S1093XA Contusion of unspecified part of neck, initial encounter: Secondary | ICD-10-CM | POA: Diagnosis not present

## 2022-10-17 DIAGNOSIS — M549 Dorsalgia, unspecified: Secondary | ICD-10-CM

## 2022-10-17 DIAGNOSIS — M5030 Other cervical disc degeneration, unspecified cervical region: Secondary | ICD-10-CM

## 2022-10-17 DIAGNOSIS — W19XXXA Unspecified fall, initial encounter: Secondary | ICD-10-CM

## 2022-10-17 MED ORDER — CYCLOBENZAPRINE HCL 5 MG PO TABS: 5.0000 mg | ORAL_TABLET | Freq: Two times a day (BID) | ORAL | 0 refills | Status: DC | PRN

## 2022-10-17 MED ORDER — PREDNISONE 10 MG PO TABS: 30.0000 mg | ORAL_TABLET | Freq: Every day | ORAL | 0 refills | Status: DC

## 2022-10-17 NOTE — ED Provider Notes (Addendum)
[facility]  Note:  This document was prepared using Conservation officer, historic buildings and may include unintentional dictation errors.  MRN: [id number] DOB: 1960-11-28  Subjective:   Lauren Mcneil is a 62 y.o. female presenting for 6-day history of persistent upper back pain, neck pain.  Symptoms started after she fell about 2 to 3 feet out of her camper.  Larey Seat backwards and made impact against her back.  Has since been using Tylenol, naproxen, ibuprofen with temporary relief.  No weakness, numbness or tingling.  Patient reports that she is able to walk, twist and move her neck, torso.  She just wanted to make sure she got x-rays as she plans on traveling.  No current facility-administered medications for this encounter.  Current Outpatient Medications:    aspirin EC 81 MG tablet, Take 1 tablet (81 mg total) by mouth daily. Swallow whole., Disp: , Rfl:    Evolocumab (REPATHA SURECLICK) 140 MG/ML SOAJ, Inject 140 mg into the skin every 14 (fourteen) days., Disp: 6 mL, Rfl: 3   isosorbide mononitrate (IMDUR) 30 MG 24 hr tablet, TAKE 1 TABLET BY MOUTH EVERY DAY, Disp: 90 tablet, Rfl: 0   lisinopril-hydrochlorothiazide (ZESTORETIC) 10-12.5 MG tablet, TAKE 1 TABLET DAILY, Disp: 90 tablet, Rfl: 3   metoprolol succinate (TOPROL XL) 25 MG 24 hr tablet, Take 1 tablet (25 mg total) by mouth daily., Disp: 90 tablet, Rfl: 3   nitroGLYCERIN (NITROSTAT) 0.4 MG SL tablet, Place 1 tablet (0.4 mg total) under the tongue every 5 (five) minutes as needed for chest pain., Disp: 25 tablet, Rfl: 6   omeprazole-sodium bicarbonate (ZEGERID) 40-1100 MG capsule, Take 1 capsule by mouth daily., Disp: , Rfl:    rosuvastatin (CRESTOR) 40 MG tablet, Take 1 tablet (40 mg total) by mouth daily., Disp: 90 tablet, Rfl: 3   Allergies  Allergen Reactions   Chlorhexidine Rash   Sulfa Antibiotics Other (See Comments)    Burning sensation     Past Medical History:  Diagnosis Date   GERD  (gastroesophageal reflux disease)    Hypercholesteremia    Hypertension      Past Surgical History:  Procedure Laterality Date   ABDOMINAL HYSTERECTOMY     LEFT HEART CATH AND CORONARY ANGIOGRAPHY N/A 07/10/2020   Procedure: LEFT HEART CATH AND CORONARY ANGIOGRAPHY;  Surgeon: Lyn Records, MD;  Location: [facility];  Service: Cardiovascular;  Laterality: N/A;   VAGINAL DELIVERY      Family History  Problem Relation Age of Onset   Diabetes Mother    Heart attack Father     Social History   Tobacco Use   Smoking status: Former    Types: Cigarettes    Quit date: 2003    Years since quitting: 21.4   Smokeless tobacco: Never  Vaping Use   Vaping Use: Never used  Substance Use Topics   Alcohol use: Never   Drug use: Never    ROS   Objective:   Vitals: BP (!) 158/94 (BP Location: Right Arm)   Pulse 71   Temp 99.1 F (37.3 C) (Oral)   Resp 20   SpO2 97%   Physical Exam Constitutional:      General: She is not in acute distress.    Appearance: Normal appearance. She is well-developed. She is not ill-appearing, toxic-appearing or diaphoretic.  HENT:     Head: Normocephalic and atraumatic.     Nose: Nose normal.     Mouth/Throat:  Mouth: Mucous membranes are moist.  Eyes:     General: No scleral icterus.       Right eye: No discharge.        Left eye: No discharge.     Extraocular Movements: Extraocular movements intact.  Cardiovascular:     Rate and Rhythm: Normal rate.  Pulmonary:     Effort: Pulmonary effort is normal.  Musculoskeletal:     Comments: Full range of motion throughout including flexion, extension, rotation, lateral flexion.  Strength 5/5 for upper and lower extremities.  Patient ambulates without any assistance at expected pace.  No ecchymosis, swelling, lacerations or abrasions.  Patient does have paraspinal muscle tenderness along the lower cervical regions, thoracic back excluding the midline.  No tenderness appreciated along the  lumbar region.  No ecchymosis, bruising, swelling.  Skin:    General: Skin is warm and dry.  Neurological:     General: No focal deficit present.     Mental Status: She is alert and oriented to person, place, and time.     Motor: No weakness.     Coordination: Coordination normal.     Gait: Gait normal.     Deep Tendon Reflexes: Reflexes normal.  Psychiatric:        Mood and Affect: Mood normal.        Behavior: Behavior normal.     Assessment and Plan :   PDMP not reviewed this encounter.  1. Neck pain   2. Contusion of neck, initial encounter   3. Upper back pain   4. Accidental fall, initial encounter    X-ray over-read was pending at time of discharge, recommended follow up with only abnormal results. Otherwise will not call for negative over-read. Patient was in agreement. Will manage conservatively for back contusion with continued use of Tylenol and muscle relaxant, rest and modification of physical activity.  Anticipatory guidance provided.  Counseled patient on potential for adverse effects with medications prescribed/recommended today, ER and return-to-clinic precautions discussed, patient verbalized understanding.    [name], [place] 10/17/22 1433   UPDATE: DG Cervical Spine 2-3 Views  Result Date: 10/17/2022 CLINICAL DATA:  Recent fall, neck pain EXAM: CERVICAL SPINE - 2-3 VIEW COMPARISON:  None Available. FINDINGS: No recent fracture is seen. There is minimal anterolisthesis at C5-C6 level. Degenerative changes are noted in facet joints at multiple levels. Bilateral cervical ribs are seen. Prevertebral soft tissues are unremarkable. IMPRESSION: No recent fracture is seen in cervical spine. Cervical spondylosis. There is minimal anterolisthesis at C5-C6 level. Bilateral cervical ribs. Electronically Signed   By: Ernie Avena M.D.   On: 10/17/2022 14:47    1. Neck pain   2. Contusion of neck, initial encounter   3. Upper back pain   4. Accidental fall,  initial encounter   5. Degenerative disc disease, cervical    Recommend an oral prednisone course for her pain in the context of her degenerative disc disease. Maintain all other recommendations.    [name], PA-C 10/17/22 1500

## 2022-10-17 NOTE — ED Triage Notes (Signed)
Pt reports she fell stepping out of camper 5/24-c/o pain to upper back,shoulders, posterior neck-taking naproxen, tylenol, ibuprofen-NAD-steady gait

## 2022-11-27 ENCOUNTER — Other Ambulatory Visit: Payer: Self-pay

## 2022-11-27 MED ORDER — METOPROLOL SUCCINATE ER 25 MG PO TB24: 25.0000 mg | ORAL_TABLET | Freq: Every day | ORAL | 0 refills | Status: DC

## 2022-12-09 ENCOUNTER — Ambulatory Visit
Admission: RE | Admit: 2022-12-09 | Discharge: 2022-12-09 | Disposition: A | Discharge status: Home / Self Care | Source: Ambulatory Visit | Attending: Emergency Medicine | Admitting: Emergency Medicine

## 2022-12-09 VITALS — BP 121/82 | HR 79 | Temp 97.9°F | Resp 16

## 2022-12-09 DIAGNOSIS — Z8619 Personal history of other infectious and parasitic diseases: Secondary | ICD-10-CM | POA: Diagnosis not present

## 2022-12-09 DIAGNOSIS — W57XXXA Bitten or stung by nonvenomous insect and other nonvenomous arthropods, initial encounter: Secondary | ICD-10-CM | POA: Diagnosis not present

## 2022-12-09 MED ORDER — DOXYCYCLINE HYCLATE 100 MG PO CAPS
100.0000 mg | ORAL_CAPSULE | Freq: Two times a day (BID) | ORAL | 0 refills | Status: DC
Start: 2022-12-09 — End: 2023-06-10

## 2022-12-09 NOTE — Discharge Instructions (Signed)
Take antibiotics as prescribed to cover for Lyme disease. Current recommendations are for a 10-day course and your symptoms should resolve in the next couple weeks. Follow-up with PCP for further evaluation if persistent symptoms.

## 2022-12-09 NOTE — ED Provider Notes (Signed)
[facility]    CSN: [id number] Arrival date & time: 12/09/22  1227      History   Chief Complaint Chief Complaint  Patient presents with   Insect Bite    Need blood work for lymes disease.  Have body aches, joint pains, fatigue, fever, bug bites from camping, including  two embedded tick removals.  Had targets a few years ago, treated at [facility] in [place]. - Entered by patient    HPI Lauren Mcneil is a 62 y.o. female.   Patient presents with concerns of possible Lyme disease. The patient reports in the past she had target rashes from tick bites and was treated successfully for Lyme disease. She was recently camping for two weeks and had multiple tick bites. She reports for the past few days she has had fatigue, headaches, and diffuse body aches. She has felt feverish and had chills but no measured temperature. She states she has red areas at the bite sites but no obvious target rash. The patient has taken OTC medicine with minimal improvement. The patient states her previous Lyme felt similar though not this bad.  The history is provided by the patient.    Past Medical History:  Diagnosis Date   GERD (gastroesophageal reflux disease)    Hypercholesteremia    Hypertension     Patient Active Problem List   Diagnosis Date Noted   Angina pectoris (HCC) 07/09/2020   CAD (coronary artery disease), native coronary artery 07/09/2020   Hyperlipidemia with target LDL less than 70 07/09/2020   Primary hypertension 07/09/2020    Past Surgical History:  Procedure Laterality Date   ABDOMINAL HYSTERECTOMY     LEFT HEART CATH AND CORONARY ANGIOGRAPHY N/A 07/10/2020   Procedure: LEFT HEART CATH AND CORONARY ANGIOGRAPHY;  Surgeon: Lyn Records, MD;  [facility]: [facility];  Service: Cardiovascular;  Laterality: N/A;   VAGINAL DELIVERY      OB History     Gravida      Para      Term      Preterm      AB      Living  3      SAB      IAB       Ectopic      Multiple      Live Births               Home Medications    Prior to Admission medications   Medication Sig Start Date End Date Taking? Authorizing Provider  doxycycline (VIBRAMYCIN) 100 MG capsule Take 1 capsule (100 mg total) by mouth 2 (two) times daily. 12/09/22  Yes Saveah Bahar L, PA  aspirin EC 81 MG tablet Take 1 tablet (81 mg total) by mouth daily. Swallow whole. 01/02/22   Lyn Records, MD  cyclobenzaprine (FLEXERIL) 5 MG tablet Take 1 tablet (5 mg total) by mouth 2 (two) times daily as needed for muscle spasms. 10/17/22   [name], PA-C  Evolocumab (REPATHA SURECLICK) 140 MG/ML SOAJ Inject 140 mg into the skin every 14 (fourteen) days. 07/04/22   Malena Peer D, RPH-CPP  isosorbide mononitrate (IMDUR) 30 MG 24 hr tablet TAKE 1 TABLET BY MOUTH EVERY DAY 10/03/21   Gaston Islam., NP  lisinopril-hydrochlorothiazide (ZESTORETIC) 10-12.5 MG tablet TAKE 1 TABLET DAILY 06/17/22   Gaston Islam., NP  metoprolol succinate (TOPROL XL) 25 MG 24 hr tablet Take 1 tablet (25 mg total) by mouth  daily. 11/27/22   Gaston Islam., NP  nitroGLYCERIN (NITROSTAT) 0.4 MG SL tablet Place 1 tablet (0.4 mg total) under the tongue every 5 (five) minutes as needed for chest pain. 04/06/21   Corky Crafts, MD  omeprazole-sodium bicarbonate (ZEGERID) 40-1100 MG capsule Take 1 capsule by mouth daily. 04/11/20   [provider]  predniSONE (DELTASONE) 10 MG tablet Take 3 tablets (30 mg total) by mouth daily with breakfast. 10/17/22   [name], PA-C  rosuvastatin (CRESTOR) 40 MG tablet Take 1 tablet (40 mg total) by mouth daily. 05/22/22   Lyn Records, MD    Family History Family History  Problem Relation Age of Onset   Diabetes Mother    Heart attack Father     Social History Social History   Tobacco Use   Smoking status: Former    Current packs/day: 0.00    Types: Cigarettes    Quit date: 2003    Years since quitting: 21.5   Smokeless tobacco:  Never  Vaping Use   Vaping status: Never Used  Substance Use Topics   Alcohol use: Never   Drug use: Never     Allergies   Chlorhexidine and Sulfa antibiotics   Review of Systems Review of Systems  Constitutional:  Positive for chills and diaphoresis.  HENT:  Negative for congestion and sore throat.   Respiratory:  Negative for cough and shortness of breath.   Cardiovascular:  Negative for chest pain.  Gastrointestinal:  Negative for nausea and vomiting.  Musculoskeletal:  Positive for myalgias.  Skin:  Positive for rash.  Neurological:  Positive for headaches. Negative for dizziness and weakness.     Physical Exam Triage Vital Signs ED Triage Vitals  Encounter Vitals Group     BP 12/09/22 1239 121/82     Systolic BP Percentile --      Diastolic BP Percentile --      Pulse Rate 12/09/22 1239 79     Resp 12/09/22 1239 16     Temp 12/09/22 1239 97.9 F (36.6 C)     Temp Source 12/09/22 1239 Oral     SpO2 12/09/22 1239 95 %     Weight --      Height --      Head Circumference --      Peak Flow --      Pain Score 12/09/22 1246 1     Pain Loc --      Pain Education --      Exclude from Growth Chart --    No data found.  Updated Vital Signs BP 121/82 (BP Location: Right Arm)   Pulse 79   Temp 97.9 F (36.6 C) (Oral)   Resp 16   SpO2 95%   Visual Acuity Right Eye Distance:   Left Eye Distance:   Bilateral Distance:    Right Eye Near:   Left Eye Near:    Bilateral Near:     Physical Exam Vitals and nursing note reviewed.  Constitutional:      General: She is not in acute distress. HENT:     Head: Normocephalic.  Eyes:     Conjunctiva/sclera: Conjunctivae normal.     Pupils: Pupils are equal, round, and reactive to light.  Cardiovascular:     Rate and Rhythm: Normal rate and regular rhythm.     Heart sounds: Normal heart sounds.  Pulmonary:     Effort: Pulmonary effort is normal.     Breath sounds: Normal breath  sounds.  Skin:    Comments:  Multiple scattered small scabbed papules with surrounding erythema to trunk consistent with bug bites.  Neurological:     General: No focal deficit present.     Mental Status: She is alert.     Gait: Gait normal.  Psychiatric:        Mood and Affect: Mood normal.      UC Treatments / Results  Labs (all labs ordered are listed, but only abnormal results are displayed) Labs Reviewed - No data to display  EKG   Radiology No results found.  Procedures Procedures (including critical care time)  Medications Ordered in UC Medications - No data to display  Initial Impression / Assessment and Plan / UC Course  I have reviewed the triage vital signs and the nursing notes.  Pertinent labs & imaging results that were available during my care of the patient were reviewed by me and considered in my medical decision making (see chart for details).     Given symptoms and known exposure, as well as history of Lyme, will empirically treat with doxycycline. Discussed f/u precautions. No other symptoms indicating viral process at this time.   E/M: 1 acute uncomplicated illness, no data, moderate risk due to prescription management  Final Clinical Impressions(s) / UC Diagnoses   Final diagnoses:  Tick bite, unspecified site, initial encounter  History of Lyme disease     Discharge Instructions      Take antibiotics as prescribed to cover for Lyme disease. Current recommendations are for a 10-day course and your symptoms should resolve in the next couple weeks. Follow-up with PCP for further evaluation if persistent symptoms.    ED Prescriptions     Medication Sig Dispense Auth. Provider   doxycycline (VIBRAMYCIN) 100 MG capsule Take 1 capsule (100 mg total) by mouth 2 (two) times daily. 20 capsule Vallery Sa, Xavian Hardcastle L, PA      PDMP not reviewed this encounter.   Estanislado Pandy, [place] 12/09/22 [phone]

## 2022-12-09 NOTE — ED Triage Notes (Signed)
Pt presents to UC w/ c/o body aches, fever, chills, generalized rash on abdomen and back x5 days. Pt has tried cortisone cream did not help. Hx lymes disease in the past.

## 2023-02-25 ENCOUNTER — Other Ambulatory Visit: Payer: Self-pay | Admitting: Nurse Practitioner

## 2023-03-28 ENCOUNTER — Other Ambulatory Visit: Payer: Self-pay | Admitting: Nurse Practitioner

## 2023-04-10 ENCOUNTER — Telehealth: Payer: Self-pay | Admitting: Gastroenterology

## 2023-04-10 NOTE — Telephone Encounter (Signed)
Patient called and requested to be seen here. she was not receiving the best care at her previous GI. I explained to her that we will be needing to do a transfer of care and requested colonoscopy and EGD records as well as pathology reports. Patient is currently being seen in [facility].

## 2023-04-22 NOTE — Telephone Encounter (Signed)
Hi Dr. Chales Abrahams,    Patient is wishing to transfer her care specifically over to you. Patient is wishing to be seen for a endoscopy and colonoscopy. Patient has history with [facility]. Patient is wishing to transfer her care due to not being happy with care provided. Patient's previous records are in Epic media for you to review and advise on scheduling.    Thank you.

## 2023-04-28 NOTE — Telephone Encounter (Signed)
Pl make appt @ [facility]  RG

## 2023-05-05 ENCOUNTER — Encounter: Payer: Self-pay | Admitting: Gastroenterology

## 2023-05-05 NOTE — Telephone Encounter (Signed)
Called patient to schedule. Left voicemail

## 2023-05-12 ENCOUNTER — Other Ambulatory Visit: Payer: Self-pay

## 2023-05-12 MED ORDER — ROSUVASTATIN CALCIUM 40 MG PO TABS: 40.0000 mg | ORAL_TABLET | Freq: Every day | ORAL | 0 refills | Status: DC

## 2023-05-29 ENCOUNTER — Other Ambulatory Visit: Payer: Self-pay

## 2023-05-29 MED ORDER — METOPROLOL SUCCINATE ER 25 MG PO TB24: 25.0000 mg | ORAL_TABLET | Freq: Every day | ORAL | 0 refills | Status: DC

## 2023-06-10 ENCOUNTER — Ambulatory Visit (HOSPITAL_BASED_OUTPATIENT_CLINIC_OR_DEPARTMENT_OTHER)
Admission: RE | Admit: 2023-06-10 | Discharge: 2023-06-10 | Disposition: A | Discharge status: Home / Self Care | Source: Ambulatory Visit | Attending: Urology | Admitting: Urology

## 2023-06-10 ENCOUNTER — Ambulatory Visit (INDEPENDENT_AMBULATORY_CARE_PROVIDER_SITE_OTHER): Admitting: Urology

## 2023-06-10 ENCOUNTER — Encounter: Payer: Self-pay | Admitting: Urology

## 2023-06-10 VITALS — BP 135/83 | HR 76 | Ht 60.0 in | Wt 166.0 lb

## 2023-06-10 DIAGNOSIS — N200 Calculus of kidney: Secondary | ICD-10-CM | POA: Insufficient documentation

## 2023-06-10 DIAGNOSIS — Z87442 Personal history of urinary calculi: Secondary | ICD-10-CM

## 2023-06-10 DIAGNOSIS — Z87440 Personal history of urinary (tract) infections: Secondary | ICD-10-CM | POA: Diagnosis not present

## 2023-06-10 DIAGNOSIS — R399 Unspecified symptoms and signs involving the genitourinary system: Secondary | ICD-10-CM | POA: Diagnosis not present

## 2023-06-10 MED ORDER — NITROFURANTOIN MACROCRYSTAL 50 MG PO CAPS: 50.0000 mg | ORAL_CAPSULE | Freq: Every evening | ORAL | 0 refills | Status: DC

## 2023-06-10 MED ORDER — ESTRADIOL 0.1 MG/GM VA CREA: TOPICAL_CREAM | VAGINAL | 12 refills | Status: AC

## 2023-06-10 NOTE — Progress Notes (Signed)
Assessment: 1. Lower urinary tract symptoms (LUTS)   2. Nephrolithiasis      Plan: Today I had a long discussion with the patient regarding UTI and stone prevention. Patient will begin cranberry supplementation, urinary probiotic with lactobacillus as well as vaginal estrogen cream-prescription provided. Will also begin low-dose suppression with Macrodantin CT stone study to assess stone burden in setting of recurrent nephrolithiasis  Chief Complaint: recurrent uti/kidney stones  History of Present Illness:  Lauren Mcneil is a 63 y.o. female who is seen in consultation from Clinton Sawyer, MD for evaluation of nephrolithiasis and recurrent UTI.  Patient reports that she has had frequent recurrent uncomplicated UTIs for at least the last 10 years.  They began after she underwent a TAH/BSO for abnormal uterine bleeding in 2012.  She was evaluated by urology in [place] around 2014 and underwent cystoscopy which was felt to be normal.  Last month she completed a course of Cipro and Macrobid for a UTI.  Patient also has a long history of recurrent nephrolithiasis.  She states she passes a stone at least once a year.  She reports passing a stone last month.  No prior need for surgical intervention.  Urinalysis today is negative for significant blood or infection   Past Medical History:  Past Medical History:  Diagnosis Date   GERD (gastroesophageal reflux disease)    Hypercholesteremia    Hypertension     Past Surgical History:  Past Surgical History:  Procedure Laterality Date   ABDOMINAL HYSTERECTOMY     LEFT HEART CATH AND CORONARY ANGIOGRAPHY N/A 07/10/2020   Procedure: LEFT HEART CATH AND CORONARY ANGIOGRAPHY;  Surgeon: Lyn Records, MD;  Location: [facility];  Service: Cardiovascular;  Laterality: N/A;   VAGINAL DELIVERY      Allergies:  Allergies  Allergen Reactions   Chlorhexidine Rash   Sulfa Antibiotics Other (See Comments)    Burning sensation      Family History:  Family History  Problem Relation Age of Onset   Diabetes Mother    Heart attack Father     Social History:  Social History   Tobacco Use   Smoking status: Former    Current packs/day: 0.00    Types: Cigarettes    Quit date: 2003    Years since quitting: 22.0   Smokeless tobacco: Never  Vaping Use   Vaping status: Never Used  Substance Use Topics   Alcohol use: Never   Drug use: Never    Review of symptoms:  Constitutional:  Negative for unexplained weight loss, night sweats, fever, chills ENT:  Negative for nose bleeds, sinus pain, painful swallowing CV:  Negative for chest pain, shortness of breath, exercise intolerance, palpitations, loss of consciousness Resp:  Negative for cough, wheezing, shortness of breath GI:  Negative for nausea, vomiting, diarrhea, bloody stools GU:  Positives noted in HPI; otherwise negative for gross hematuria, dysuria, urinary incontinence Neuro:  Negative for seizures, poor balance, limb weakness, slurred speech Psych:  Negative for lack of energy, depression, anxiety Endocrine:  Negative for polydipsia, polyuria, symptoms of hypoglycemia (dizziness, hunger, sweating) Hematologic:  Negative for anemia, purpura, petechia, prolonged or excessive bleeding, use of anticoagulants  Allergic:  Negative for difficulty breathing or choking as a result of exposure to anything; no shellfish allergy; no allergic response (rash/itch) to materials, foods  Physical exam: BP 135/83   Pulse 76   Ht 5' (1.524 m)   Wt 166 lb (75.3 kg)   BMI 32.42  kg/m  GENERAL APPEARANCE:  Well appearing, well developed, well nourished, NAD   Results: UA negative for significant blood or infection today

## 2023-06-11 LAB — URINALYSIS, ROUTINE W REFLEX MICROSCOPIC
Bilirubin, UA: NEGATIVE
Glucose, UA: NEGATIVE
Ketones, UA: NEGATIVE
Leukocytes,UA: NEGATIVE
Nitrite, UA: NEGATIVE
Protein,UA: NEGATIVE
Specific Gravity, UA: 1.02 (ref 1.005–1.030)
Urobilinogen, Ur: 0.2 mg/dL (ref 0.2–1.0)
pH, UA: 7 (ref 5.0–7.5)

## 2023-06-11 LAB — MICROSCOPIC EXAMINATION

## 2023-06-12 ENCOUNTER — Encounter: Payer: Self-pay | Admitting: Urology

## 2023-06-17 ENCOUNTER — Ambulatory Visit: Attending: Cardiology | Admitting: Cardiology

## 2023-06-17 ENCOUNTER — Encounter: Payer: Self-pay | Admitting: Urology

## 2023-06-17 ENCOUNTER — Encounter: Payer: Self-pay | Admitting: Cardiology

## 2023-06-17 VITALS — BP 126/82 | HR 60 | Resp 16 | Ht 60.0 in | Wt 169.0 lb

## 2023-06-17 DIAGNOSIS — I25118 Atherosclerotic heart disease of native coronary artery with other forms of angina pectoris: Secondary | ICD-10-CM | POA: Diagnosis not present

## 2023-06-17 DIAGNOSIS — E785 Hyperlipidemia, unspecified: Secondary | ICD-10-CM | POA: Diagnosis not present

## 2023-06-17 DIAGNOSIS — I10 Essential (primary) hypertension: Secondary | ICD-10-CM

## 2023-06-17 MED ORDER — ROSUVASTATIN CALCIUM 40 MG PO TABS
40.0000 mg | ORAL_TABLET | Freq: Every day | ORAL | 0 refills | Status: AC
Start: 2023-06-17 — End: ?

## 2023-06-17 MED ORDER — EZETIMIBE 10 MG PO TABS: 10.0000 mg | ORAL_TABLET | ORAL | 0 refills | Status: AC

## 2023-06-17 MED ORDER — METOPROLOL SUCCINATE ER 25 MG PO TB24
25.0000 mg | ORAL_TABLET | Freq: Every day | ORAL | 0 refills | Status: AC
Start: 2023-06-17 — End: ?

## 2023-06-17 MED ORDER — NITROGLYCERIN 0.4 MG SL SUBL: 0.4000 mg | SUBLINGUAL_TABLET | SUBLINGUAL | 6 refills | Status: AC | PRN

## 2023-06-17 MED ORDER — LISINOPRIL-HYDROCHLOROTHIAZIDE 10-12.5 MG PO TABS: 1.0000 | ORAL_TABLET | ORAL | 0 refills | Status: DC

## 2023-06-17 NOTE — Progress Notes (Signed)
Cardiology Office Note:  .   Date:  06/17/2023  ID:  Lauren Mcneil, DOB 04/29/1961, MRN [id number] PCP: Clinton Sawyer, MD  [facility] Providers Cardiologist:  Yates Decamp, MD   History of Present Illness: .   Lauren Mcneil is a 63 y.o. Female patient with primary hypertension, hypercholesterolemia, history of premature coronary disease, -father had myocardial infarction and died at age 83, coronary calcium score in the 99th percentile severely diseased distal LAD and diffuse disease in the RCA by angiography in 2022 presents for annual visit.  Discussed the use of AI scribe software for clinical note transcription with the patient, who gave verbal consent to proceed.  History of Present Illness   Lauren Mcneil, a patient with a history of high blood pressure, high cholesterol, and coronary artery disease, presents for a routine cardiology follow-up. The patient's coronary calcium score was notably high, placing her in the 99th percentile. A heart catheterization revealed disease in the distal LAD and right coronary artery. However, these conditions have been managed medically without the need for stents. The patient has not had a cardiac event in the three years since the catheterization.  The patient also recently had a bladder infection following a kidney stone. The patient is currently taking Crestor 40mg  for cholesterol management. She has previously tried Zetia, which caused muscle aches. The patient is considering adding Zetia back into her regimen on an every-other-day basis to further lower her cholesterol.  The patient is also taking lisinopril and metoprolol for blood pressure management. The patient is considering getting a comprehensive wellness check at [facility], which would include a cholesterol check. The patient is active, getting up to 20,000 steps a day while remodeling a home. The patient is planning to travel to [place] and [place] in the near future.       Labs   Lab  Results  Component Value Date   CHOL 145 07/04/2022   HDL 41 07/04/2022   LDLCALC 80 07/04/2022   TRIG 137 07/04/2022   CHOLHDL 3.5 07/04/2022   Lab Results  Component Value Date   NA 136 01/15/2022   K 4.1 01/15/2022   CO2 23 01/15/2022   GLUCOSE 87 01/15/2022   BUN 12 01/15/2022   CREATININE 0.79 01/15/2022   CALCIUM 10.2 01/15/2022   EGFR 85 01/15/2022   GFRNONAA >60 06/17/2020      Latest Ref Rng & Units 01/15/2022   10:45 AM 06/17/2020    7:55 PM 07/05/2018    6:42 PM  BMP  Glucose 70 - 99 mg/dL 87  213  086   BUN 8 - 27 mg/dL 12  19  12    Creatinine 0.57 - 1.00 mg/dL 5.78  4.69  6.29   BUN/Creat Ratio 12 - 28 15     Sodium 134 - 144 mmol/L 136  132  134   Potassium 3.5 - 5.2 mmol/L 4.1  3.9  3.5   Chloride 96 - 106 mmol/L 97  102  102   CO2 20 - 29 mmol/L 23  23  22    Calcium 8.7 - 10.3 mg/dL 52.8  9.1  9.3       Latest Ref Rng & Units 06/17/2020    7:55 PM 07/05/2018    6:42 PM  CBC  WBC 4.0 - 10.5 K/uL 7.3  11.9   Hemoglobin 12.0 - 15.0 g/dL 41.3  24.4   Hematocrit 36.0 - 46.0 % 40.0  41.4   Platelets 150 - 400 K/uL  239  277    Review of Systems  Cardiovascular:  Negative for chest pain, dyspnea on exertion and leg swelling.    Physical Exam:   VS:  BP 126/82 (BP Location: Right Arm, Patient Position: Sitting, Cuff Size: Large)   Pulse 60   Resp 16   Ht 5' (1.524 m)   Wt 169 lb (76.7 kg)   SpO2 93%   BMI 33.01 kg/m    Wt Readings from Last 3 Encounters:  06/17/23 169 lb (76.7 kg)  06/10/23 166 lb (75.3 kg)  01/02/22 163 lb 9.6 oz (74.2 kg)     Physical Exam Constitutional:      Appearance: She is obese.  Neck:     Vascular: No carotid bruit or JVD.  Cardiovascular:     Rate and Rhythm: Normal rate and regular rhythm.     Pulses: Intact distal pulses.     Heart sounds: Normal heart sounds. No murmur heard.    No gallop.  Pulmonary:     Effort: Pulmonary effort is normal.     Breath sounds: Normal breath sounds.  Abdominal:     General:  Bowel sounds are normal.     Palpations: Abdomen is soft.  Musculoskeletal:     Right lower leg: No edema.     Left lower leg: No edema.    Studies Reviewed: Marland Kitchen    CARDIAC CATHETERIZATION 07/10/2020    EKG:    EKG Interpretation Date/Time:  Tuesday June 17 2023 10:45:57 EST Ventricular Rate:  59 PR Interval:  162 QRS Duration:  88 QT Interval:  432 QTC Calculation: 427 R Axis:   -9  Text Interpretation: EKG 06/17/2023: Normal sinus rhythm at rate of 59 bpm, normal axis, low voltage complexes.  Compared to 06/17/2020, no significant change. Confirmed by Delrae Rend [phone]) on 06/17/2023 10:57:32 AM    Medications and allergies    Allergies  Allergen Reactions  . Chlorhexidine Rash  . Sulfa Antibiotics Other (See Comments)    Burning sensation      Current Outpatient Medications:  .  aspirin EC 81 MG tablet, Take 1 tablet (81 mg total) by mouth daily. Swallow whole., Disp: , Rfl:  .  estradiol (ESTRACE) 0.1 MG/GM vaginal cream, Apply a pea-sized amount 3x weekly using finger tip., Disp: 42.5 g, Rfl: 12 .  ezetimibe (ZETIA) 10 MG tablet, Take 1 tablet (10 mg total) by mouth every other day., Disp: 90 tablet, Rfl: 0 .  nitrofurantoin (MACRODANTIN) 50 MG capsule, Take 1 capsule (50 mg total) by mouth at bedtime., Disp: 45 capsule, Rfl: 0 .  omeprazole-sodium bicarbonate (ZEGERID) 40-1100 MG capsule, Take 1 capsule by mouth daily., Disp: , Rfl:  .  lisinopril-hydrochlorothiazide (ZESTORETIC) 10-12.5 MG tablet, Take 1 tablet by mouth every morning., Disp: 90 tablet, Rfl: 0 .  metoprolol succinate (TOPROL-XL) 25 MG 24 hr tablet, Take 1 tablet (25 mg total) by mouth daily., Disp: 90 tablet, Rfl: 0 .  nitroGLYCERIN (NITROSTAT) 0.4 MG SL tablet, Place 1 tablet (0.4 mg total) under the tongue every 5 (five) minutes as needed for chest pain., Disp: 25 tablet, Rfl: 6 .  rosuvastatin (CRESTOR) 40 MG tablet, Take 1 tablet (40 mg total) by mouth daily., Disp: 90 tablet, Rfl: 0    ASSESSMENT AND PLAN: .      ICD-10-CM   1. Coronary artery disease involving native coronary artery of native heart with other form of angina pectoris (HCC)  I25.118 EKG 12-Lead    metoprolol succinate (TOPROL-XL) 25  MG 24 hr tablet    2. Hyperlipidemia LDL goal <70  E78.5 ezetimibe (ZETIA) 10 MG tablet    rosuvastatin (CRESTOR) 40 MG tablet    3. Primary hypertension  I10 lisinopril-hydrochlorothiazide (ZESTORETIC) 10-12.5 MG tablet     Assessment and Plan    Coronary Artery Disease Stable angina, no cardiac events since heart catheterization in 2022. 50% blockage in distal LAD and diffuse disease in right coronary artery managed medically. LDL slightly elevated at 80. -Continue Rosuvastatin 40mg  daily. -Add Ezetimibe (Zetia) every other day to further lower LDL. -Continue metoprolol succinate 25 mg daily which is helping with both CAD and hypertension. -Check cholesterol levels in 2-3 months. -With patient getting an thousand to 20,000 steps a day with no chest pain or dyspnea, completely asymptomatic and with risk factors are well-controlled, I can see her back on a as needed basis.  Hypertension Well controlled. -Continue Lisinopril-Hydrochlorothiazide 10/12.5 mg daily, advised to take in the morning.  Hyperlipidemia LDL slightly elevated, patient experiences muscle aches with Ezetimibe. -Add Ezetimibe (Zetia) every other day to further lower LDL to <70. -Check cholesterol levels in 2-3 months.  General Health Maintenance -Encouraged lifestyle modifications including diet and exercise for weight loss. -Check Vitamin D levels. -Transition care to primary care physician, Dr. Kizzie Bane. -Plan to see Dr. Sherryll Burger in 2-2.5 months for cholesterol check and medication refills.    Signed,  Yates Decamp, MD, [facility] 06/17/2023, 9:35 PM [facility] [address] [place], [place] [address] Phone: [phone]. Fax:  [phone]

## 2023-06-17 NOTE — Patient Instructions (Addendum)
Medication Instructions:  Start Zetia 10 mg by mouth every other day *If you need a refill on your cardiac medications before your next appointment, please call your pharmacy*   Lab Work: none If you have labs (blood work) drawn today and your tests are completely normal, you will receive your results only by: MyChart Message (if you have MyChart) OR A paper copy in the mail If you have any lab test that is abnormal or we need to change your treatment, we will call you to review the results.   Testing/Procedures: none   Follow-Up: At [facility], you and your health needs are our priority.  As part of our continuing mission to provide you with exceptional heart care, we have created designated Provider Care Teams.  These Care Teams include your primary Cardiologist (physician) and Advanced Practice Providers (APPs -  Physician Assistants and Nurse Practitioners) who all work together to provide you with the care you need, when you need it.  We recommend signing up for the patient portal called "MyChart".  Sign up information is provided on this After Visit Summary.  MyChart is used to connect with patients for Virtual Visits (Telemedicine).  Patients are able to view lab/test results, encounter notes, upcoming appointments, etc.  Non-urgent messages can be sent to your provider as well.   To learn more about what you can do with MyChart, go to [email or web address].    Your next appointment:   As needed  Provider:   Yates Decamp, MD     Other Instructions

## 2023-07-17 ENCOUNTER — Encounter: Payer: Self-pay | Admitting: Gastroenterology

## 2023-07-17 ENCOUNTER — Ambulatory Visit: Admitting: Gastroenterology

## 2023-07-17 VITALS — BP 128/82 | HR 65 | Ht 60.0 in | Wt 169.4 lb

## 2023-07-17 DIAGNOSIS — Z860100 Personal history of colon polyps, unspecified: Secondary | ICD-10-CM | POA: Diagnosis not present

## 2023-07-17 DIAGNOSIS — K219 Gastro-esophageal reflux disease without esophagitis: Secondary | ICD-10-CM

## 2023-07-17 DIAGNOSIS — K5909 Other constipation: Secondary | ICD-10-CM

## 2023-07-17 DIAGNOSIS — K2270 Barrett's esophagus without dysplasia: Secondary | ICD-10-CM

## 2023-07-17 DIAGNOSIS — Z8719 Personal history of other diseases of the digestive system: Secondary | ICD-10-CM

## 2023-07-17 MED ORDER — SUFLAVE 178.7 G PO SOLR: 1.0000 | Freq: Once | ORAL | 0 refills | Status: DC

## 2023-07-17 MED ORDER — SUFLAVE 178.7 G PO SOLR: 1.0000 | Freq: Once | ORAL | 0 refills | Status: AC

## 2023-07-17 NOTE — Progress Notes (Signed)
 Chief Complaint: Establish care Primary GI MD: Dr. Chales Abrahams  HPI: 63 year old female history of hypertension, hypercholesterolemia, CAD s/p cardiac cath 2022, Barrett's esophagitis, tortuous colon, Lyme disease, presents to transfer care show  Follows with Dr. Jacinto Halim currently has a history of stable angina since 2022 with 50% blockage in distal LAD.  Previous history of [facility] gastroenterology requesting to transfer care to Dr. Chales Abrahams  Barrett's esophagus with EGD 2007, EGD 2015.  No Barrett's on 2015 EGD or EGD 12/2017.  Colonoscopy 2012 normal except for tortuous adherent colon Colonoscopy 2020 with hyperplastic polyp with repeat recommended 2030  -------------TODAY------------------------- Discussed the use of AI scribe software for clinical note transcription with the patient, who gave verbal consent to proceed.  She is transferring her care to [facility] due to insurance coverage changes following her husband's retirement from the [employer]. 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  She has a history of Barrett's esophagus diagnosed in 2007. Subsequent endoscopies in 2015 and 2019 showed no Barrett's esophagus. Her GERD is managed with Zegerid once daily, with occasional extra doses as needed. Her reflux is well-controlled with this regimen. Her most recent endoscopy in 2019 revealed a 3 cm hiatal hernia and gastritis. She continues to manage these conditions with omeprazole, which helps control her symptoms.  She experiences chronic constipation, managed with Miralax, extra strength stool softeners, and Acai Berry Green Tea Glimpse fiber pills. Constipation can block urinary flow if not managed properly and is exacerbated by frequent travel. No nausea, vomiting, changes in bowel habits, or rectal bleeding, except for occasional bleeding with hard stools attributed to hemorrhoids.  She is unsure of any family history of colon cancer.  Her husband recently retired from [employer], and she has [employer].     She would like EGD/colonoscopy  PREVIOUS GI WORKUP   Colonoscopy 12/2018 Severe diverticulosis of sigmoid and moderate diverticulosis of whole colon. 2mm and 4mm hyperplastic polyps in rectum. Very adherent and tortuous colon - repeat 12/2028  EGD 11/2017 with 3 cm hiatal hernia and gastritis (negative for H pylori and negative barretts  EGD 2015 negative barretts  EGD 2007 with barretts  Past Medical History:  Diagnosis Date   Anxiety    Barrett syndrome 2007   Coronary artery disease    DDD (degenerative disc disease), cervical    Diverticulosis    GERD (gastroesophageal reflux disease)    Heart attack (HCC)    Hiatal hernia    Hx of Lyme disease    Hypercholesteremia    Hypertension    Nephrolithiasis    Tortuous colon    redundant left colon    Past Surgical History:  Procedure Laterality Date   ABDOMINAL HYSTERECTOMY     cataracts  Bilateral    Cataracts Surgery   CHOLECYSTECTOMY     COLONOSCOPY     ESOPHAGOGASTRODUODENOSCOPY     LEFT HEART CATH AND CORONARY ANGIOGRAPHY N/A 07/10/2020   Procedure: LEFT HEART CATH AND CORONARY ANGIOGRAPHY;  Surgeon: Lyn Records, MD;  Location: [facility] INVASIVE CV LAB;  Service: Cardiovascular;  Laterality: N/A;   RETINAL DETACHMENT SURGERY Left    Retina Repair   VAGINAL DELIVERY      Current Outpatient Medications  Medication Sig Dispense Refill   aspirin EC 81 MG tablet Take 1 tablet (81 mg total) by mouth daily. Swallow whole.     estradiol (ESTRACE) 0.1 MG/GM vaginal cream Apply a pea-sized amount 3x weekly using finger tip. 42.5 g 12  ezetimibe (ZETIA) 10 MG tablet Take 1 tablet (10 mg total) by mouth every other day. 90 tablet 0   lisinopril-hydrochlorothiazide (ZESTORETIC) 10-12.5 MG tablet Take 1 tablet by mouth every morning. 90 tablet 0   metoprolol succinate (TOPROL-XL) 25 MG 24 hr tablet Take 1 tablet (25 mg total) by mouth daily. 90  tablet 0   nitrofurantoin (MACRODANTIN) 50 MG capsule Take 1 capsule (50 mg total) by mouth at bedtime. 45 capsule 0   nitroGLYCERIN (NITROSTAT) 0.4 MG SL tablet Place 1 tablet (0.4 mg total) under the tongue every 5 (five) minutes as needed for chest pain. 25 tablet 6   omeprazole-sodium bicarbonate (ZEGERID) 40-1100 MG capsule Take 1 capsule by mouth daily.     rosuvastatin (CRESTOR) 40 MG tablet Take 1 tablet (40 mg total) by mouth daily. 90 tablet 0   No current facility-administered medications for this visit.    Allergies as of 07/17/2023 - Review Complete 07/17/2023  Allergen Reaction Noted   Chlorhexidine Rash 06/17/2020   Sulfa antibiotics Other (See Comments) 07/05/2018    Family History  Problem Relation Age of Onset   Diabetes Mother    Heart attack Father     Social History   Socioeconomic History   Marital status: Married    Spouse name: Not on file   Number of children: Not on file   Years of education: Not on file   Highest education level: Not on file  Occupational History   Not on file  Tobacco Use   Smoking status: Former    Current packs/day: 0.00    Types: Cigarettes    Quit date: 2003    Years since quitting: 22.1   Smokeless tobacco: Never  Vaping Use   Vaping status: Never Used  Substance and Sexual Activity   Alcohol use: Never   Drug use: Never   Sexual activity: Yes  Other Topics Concern   Not on file  Social History Narrative   Not on file   Social Drivers of Health   Financial Resource Strain: Not on file  Food Insecurity: Not on file  Transportation Needs: Not on file  Physical Activity: Not on file  Stress: Not on file  Social Connections: Not on file  Intimate Partner Violence: Not on file    Review of Systems:    Constitutional: No weight loss, fever, chills, weakness or fatigue HEENT: Eyes: No change in vision               Ears, Nose, Throat:  No change in hearing or congestion Skin: No rash or itching Cardiovascular:  No chest pain, chest pressure or palpitations   Respiratory: No SOB or cough Gastrointestinal: See HPI and otherwise negative Genitourinary: No dysuria or change in urinary frequency Neurological: No headache, dizziness or syncope Musculoskeletal: No new muscle or joint pain Hematologic: No bleeding or bruising Psychiatric: No history of depression or anxiety    Physical Exam:  Vital signs: BP 128/82   Pulse 65   Ht 5' (1.524 m)   Wt 169 lb 6 oz (76.8 kg)   BMI 33.08 kg/m   Constitutional: NAD, Well developed, Well nourished, alert and cooperative Head:  Normocephalic and atraumatic. Eyes:   PEERL, EOMI. No icterus. Conjunctiva pink. Respiratory: Respirations even and unlabored. Lungs clear to auscultation bilaterally.   No wheezes, crackles, or rhonchi.  Cardiovascular:  Regular rate and rhythm. No peripheral edema, cyanosis or pallor.  Gastrointestinal:  Soft, nondistended, nontender. No rebound or guarding. Normal bowel sounds.  No appreciable masses or hepatomegaly. Rectal:  Not performed.  Msk:  Symmetrical without gross deformities. Without edema, no deformity or joint abnormality.  Neurologic:  Alert and  oriented x4;  grossly normal neurologically.  Skin:   Dry and intact without significant lesions or rashes. Psychiatric: Oriented to person, place and time. Demonstrates good judgement and reason without abnormal affect or behaviors.   RELEVANT LABS AND IMAGING: CBC    Component Value Date/Time   WBC 7.3 06/17/2020 1955   RBC 4.57 06/17/2020 1955   HGB 13.4 06/17/2020 1955   HCT 40.0 06/17/2020 1955   PLT 239 06/17/2020 1955   MCV 87.5 06/17/2020 1955   MCH 29.3 06/17/2020 1955   MCHC 33.5 06/17/2020 1955   RDW 13.4 06/17/2020 1955   LYMPHSABS 2.7 06/17/2020 1955   MONOABS 0.9 06/17/2020 1955   EOSABS 0.3 06/17/2020 1955   BASOSABS 0.1 06/17/2020 1955    CMP     Component Value Date/Time   NA 136 01/15/2022 1045   K 4.1 01/15/2022 1045   CL 97 01/15/2022  1045   CO2 23 01/15/2022 1045   GLUCOSE 87 01/15/2022 1045   GLUCOSE 101 (H) 06/17/2020 1955   BUN 12 01/15/2022 1045   CREATININE 0.79 01/15/2022 1045   CALCIUM 10.2 01/15/2022 1045   PROT 6.5 05/21/2022 0723   ALBUMIN 4.5 05/21/2022 0723   AST 20 05/21/2022 0723   ALT 25 05/21/2022 0723   ALKPHOS 54 05/21/2022 0723   BILITOT 0.3 05/21/2022 0723   GFRNONAA >60 06/17/2020 1955   GFRAA >60 07/05/2018 1842     Assessment/Plan:      Gastroesophageal Reflux Disease (GERD) with history of Barrett's Esophagus Controlled with Zegerid once daily. History of Barrett's esophagus, but recent endoscopies in 2015 and 2019 were negative. Patient also has a 3cm hiatal hernia contributing to reflux. -Continue Zegerid once daily - Educated patient on lifestyle modifications -- Plan for repeat endoscopy to monitor for recurrence of Barrett's esophagus. -- I thoroughly discussed the procedure with the patient (at bedside) to include nature of the procedure, alternatives, benefits, and risks (including but not limited to bleeding, infection, perforation, anesthesia/cardiac pulmonary complications).  Patient verbalized understanding and gave verbal consent to proceed with procedure.   Chronic Constipation Patient reports needing to take a combination of Miralax, stool softeners, and fiber supplements to maintain regular bowel movements. --Continue current regimen as needed. --Consider adding regular Miralax for more consistent bowel movements if needed. - Can proceed with colonoscopy for further evaluation per patient request  Colon Polyps and Diverticulosis Last colonoscopy in August 2020 showed hyperplastic polyps and diverticulosis. No family history of colon cancer. Original plan for repeat 2030. Patient requesting colonoscopy. -Plan for repeat colonoscopy for screening and evaluation of constipation. -Continue current bowel regimen and increase fiber intake to prevent diverticulitis. - I  thoroughly discussed the procedure with the patient (at bedside) to include nature of the procedure, alternatives, benefits, and risks (including but not limited to bleeding, infection, perforation, anesthesia/cardiac pulmonary complications).  Patient verbalized understanding and gave verbal consent to proceed with procedure.   General Health Maintenance -Schedule endoscopy and colonoscopy after patient's planned travel in April.      Lara Mulch [facility] 07/17/2023, 11:04 AM  Cc: Clinton Sawyer, MD

## 2023-07-17 NOTE — Patient Instructions (Signed)
 You have been scheduled for an endoscopy and colonoscopy. Please follow the written instructions given to you at your visit today.  If you use inhalers (even only as needed), please bring them with you on the day of your procedure.  DO NOT TAKE 7 DAYS PRIOR TO TEST- Trulicity (dulaglutide) Ozempic, Wegovy (semaglutide) Mounjaro (tirzepatide) Bydureon Bcise (exanatide extended release)  DO NOT TAKE 1 DAY PRIOR TO YOUR TEST Rybelsus (semaglutide) Adlyxin (lixisenatide) Victoza (liraglutide) Byetta (exanatide) ___________________________________________________________________________  Bonita Quin will receive your bowel preparation through [facility], which ensures the lowest copay and home delivery, with outreach via text or call from an [phone] number. Please respond promptly to avoid rescheduling of your procedure. If you are interested in alternative options or have any questions regarding your prep, please contact them at [phone] ____________________________________________________________________________  Your Provider Has Sent Your Bowel Prep Regimen To [facility]   [facility] will contact you to verify your information and collect your copay, if applicable. Enjoy the comfort of your home while your prescription is mailed to you, FREE of any shipping charges.   [facility] accepts all major insurance benefits and applies discounts & coupons.  Have additional questions?   Chat: [email or web address] Call: [phone] Email: [email or web address] NCPDP: [id number]  How will [facility] contact you?  With a Welcome phone call,  a Welcome text and a checkout link in text form.  Texts you receive from [phone] Are NOT Spam.  *To set up delivery, you must complete the checkout process via link or speak to one of the patient care representatives. If [facility] is unable to reach you, your prescription may be delayed.  To avoid long hold times on the phone, you may also  utilize the secure chat feature on the [facility] website to request that they call you back for transaction completion or to expedite your concerns.   _______________________________________________________  If your blood pressure at your visit was 140/90 or greater, please contact your primary care physician to follow up on this.  _______________________________________________________  If you are age 41 or older, your body mass index should be between 23-30. Your Body mass index is 33.08 kg/m. If this is out of the aforementioned range listed, please consider follow up with your Primary Care Provider.  If you are age 36 or younger, your body mass index should be between 19-25. Your Body mass index is 33.08 kg/m. If this is out of the aformentioned range listed, please consider follow up with your Primary Care Provider.   ________________________________________________________  The  GI providers would like to encourage you to use [facility] to communicate with providers for non-urgent requests or questions.  Due to long hold times on the telephone, sending your provider a message by [facility] may be a faster and more efficient way to get a response.  Please allow 48 business hours for a response.  Please remember that this is for non-urgent requests.  _______________________________________________________  I appreciate the opportunity to care for you. Boone Master, PA

## 2023-09-12 ENCOUNTER — Other Ambulatory Visit: Payer: Self-pay | Admitting: Cardiology

## 2023-09-12 DIAGNOSIS — I10 Essential (primary) hypertension: Secondary | ICD-10-CM

## 2023-09-18 ENCOUNTER — Ambulatory Visit: Admitting: Gastroenterology

## 2023-09-18 ENCOUNTER — Encounter: Payer: Self-pay | Admitting: Gastroenterology

## 2023-09-18 VITALS — BP 96/58 | HR 68 | Temp 98.1°F | Resp 12 | Ht 60.0 in | Wt 169.0 lb

## 2023-09-18 DIAGNOSIS — Z1211 Encounter for screening for malignant neoplasm of colon: Secondary | ICD-10-CM

## 2023-09-18 DIAGNOSIS — K6389 Other specified diseases of intestine: Secondary | ICD-10-CM

## 2023-09-18 DIAGNOSIS — Q399 Congenital malformation of esophagus, unspecified: Secondary | ICD-10-CM | POA: Diagnosis not present

## 2023-09-18 DIAGNOSIS — K229 Disease of esophagus, unspecified: Secondary | ICD-10-CM | POA: Diagnosis not present

## 2023-09-18 DIAGNOSIS — K641 Second degree hemorrhoids: Secondary | ICD-10-CM

## 2023-09-18 DIAGNOSIS — K5909 Other constipation: Secondary | ICD-10-CM

## 2023-09-18 DIAGNOSIS — K2270 Barrett's esophagus without dysplasia: Secondary | ICD-10-CM

## 2023-09-18 DIAGNOSIS — K219 Gastro-esophageal reflux disease without esophagitis: Secondary | ICD-10-CM

## 2023-09-18 DIAGNOSIS — K449 Diaphragmatic hernia without obstruction or gangrene: Secondary | ICD-10-CM | POA: Diagnosis not present

## 2023-09-18 DIAGNOSIS — K5730 Diverticulosis of large intestine without perforation or abscess without bleeding: Secondary | ICD-10-CM

## 2023-09-18 DIAGNOSIS — D120 Benign neoplasm of cecum: Secondary | ICD-10-CM

## 2023-09-18 DIAGNOSIS — K222 Esophageal obstruction: Secondary | ICD-10-CM | POA: Diagnosis not present

## 2023-09-18 MED ORDER — SODIUM CHLORIDE 0.9 % IV SOLN
500.0000 mL | Freq: Once | INTRAVENOUS | Status: DC
Start: 2023-09-18 — End: 2023-09-18

## 2023-09-18 NOTE — Patient Instructions (Signed)
 -Handout on hiatal hernia , hemorrhoids, diverticulosis and polyps provided -await pathology results -repeat colonoscopy for surveillance recommended. Date to be determined when pathology result become available   -Continue present medications -Take Miralax 1 capful (17 grams) in 8 ounces of water every day  YOU HAD AN ENDOSCOPIC PROCEDURE TODAY AT THE [facility]:   Refer to the procedure report that was given to you for any specific questions about what was found during the examination.  If the procedure report does not answer your questions, please call your gastroenterologist to clarify.  If you requested that your care partner not be given the details of your procedure findings, then the procedure report has been included in a sealed envelope for you to review at your convenience later.  YOU SHOULD EXPECT: Some feelings of bloating in the abdomen. Passage of more gas than usual.  Walking can help get rid of the air that was put into your GI tract during the procedure and reduce the bloating. If you had a lower endoscopy (such as a colonoscopy or flexible sigmoidoscopy) you may notice spotting of blood in your stool or on the toilet paper. If you underwent a bowel prep for your procedure, you may not have a normal bowel movement for a few days.  Please Note:  You might notice some irritation and congestion in your nose or some drainage.  This is from the oxygen used during your procedure.  There is no need for concern and it should clear up in a day or so.  SYMPTOMS TO REPORT IMMEDIATELY:  Following lower endoscopy (colonoscopy or flexible sigmoidoscopy):  Excessive amounts of blood in the stool  Significant tenderness or worsening of abdominal pains  Swelling of the abdomen that is new, acute  Fever of 100F or higher  Following upper endoscopy (EGD)  Vomiting of blood or coffee ground material  New chest pain or pain under the shoulder blades  Painful or persistently  difficult swallowing  New shortness of breath  Fever of 100F or higher  Black, tarry-looking stools  For urgent or emergent issues, a gastroenterologist can be reached at any hour by calling [phone]. Do not use MyChart messaging for urgent concerns.    DIET:  We do recommend a small meal at first, but then you may proceed to your regular diet.  Drink plenty of fluids but you should avoid alcoholic beverages for 24 hours.  ACTIVITY:  You should plan to take it easy for the rest of today and you should NOT DRIVE or use heavy machinery until tomorrow (because of the sedation medicines used during the test).    FOLLOW UP: Our staff will call the number listed on your records the next business day following your procedure.  We will call around 7:15- 8:00 am to check on you and address any questions or concerns that you may have regarding the information given to you following your procedure. If we do not reach you, we will leave a message.     If any biopsies were taken you will be contacted by phone or by letter within the next 1-3 weeks.  Please call us  at [phone] if you have not heard about the biopsies in 3 weeks.    SIGNATURES/CONFIDENTIALITY: You and/or your care partner have signed paperwork which will be entered into your electronic medical record.  These signatures attest to the fact that that the information above on your After Visit Summary has been reviewed and is understood.  Full responsibility of the confidentiality of this discharge information lies with you and/or your care-partner.   Hiatal Hernia  A hiatal hernia occurs when part of the stomach slides above the muscle that separates the abdomen from the chest (diaphragm). A person can be born with a hiatal hernia (congenital), or it may develop over time. In almost all cases of hiatal hernia, only the top part of the stomach pushes through the diaphragm. Many people have a hiatal hernia with no symptoms. The  larger the hernia, the more likely it is that you will have symptoms. In some cases, a hiatal hernia allows stomach acid to flow back into the tube that carries food from your mouth to your stomach (esophagus). This may cause heartburn symptoms. The development of heartburn symptoms may mean that you have a condition called gastroesophageal reflux disease (GERD). What are the causes? This condition is caused by a weakness in the opening (hiatus) where the esophagus passes through the diaphragm to attach to the upper part of the stomach. A person may be born with a weakness in the hiatus, or a weakness can develop over time. What increases the risk? This condition is more likely to develop in: Older people. Age is a major risk factor for a hiatal hernia, especially if you are over the age of 58. Pregnant women. People who are overweight. People who have frequent constipation. What are the signs or symptoms? Symptoms of this condition usually develop in the form of GERD symptoms. Symptoms include: Heartburn. Upset stomach (indigestion). Trouble swallowing. Coughing or wheezing. Wheezing is making high-pitched whistling sounds when you breathe. Sore throat. Chest pain. Nausea and vomiting. How is this diagnosed? This condition may be diagnosed during testing for GERD. Tests that may be done include: X-rays of your stomach or chest. An upper gastrointestinal (GI) series. This is an X-ray exam of your GI tract that is taken after you swallow a chalky liquid that shows up clearly on the X-ray. Endoscopy. This is a procedure to look into your stomach using a thin, flexible tube that has a tiny camera and light on the end of it. How is this treated? This condition may be treated by: Dietary and lifestyle changes to help reduce GERD symptoms. Medicines. These may include: Over-the-counter antacids. Medicines that make your stomach empty more quickly. Medicines that block the production of stomach  acid (H2 blockers). Stronger medicines to reduce stomach acid (proton pump inhibitors). Surgery to repair the hernia, if other treatments are not helping. If you have no symptoms, you may not need treatment. Follow these instructions at home: Lifestyle and activity Do not use any products that contain nicotine or tobacco. These products include cigarettes, chewing tobacco, and vaping devices, such as e-cigarettes. If you need help quitting, ask your health care provider. Try to achieve and maintain a healthy body weight. Avoid putting pressure on your abdomen. Anything that puts pressure on your abdomen increases the amount of acid that may be pushed up into your esophagus. Avoid bending over, especially after eating. Raise the head of your bed by putting blocks under the legs. This keeps your head and esophagus higher than your stomach. Do not wear tight clothing around your chest or stomach. Try not to strain when having a bowel movement, when urinating, or when lifting heavy objects. Eating and drinking Avoid foods that can worsen GERD symptoms. These may include: Fatty foods, like fried foods. Citrus fruits, like oranges or lemon. Other foods and drinks that contain acid,  like orange juice or tomatoes. Spicy food. Chocolate. Eat frequent small meals instead of three large meals a day. This helps prevent your stomach from getting too full. Eat slowly. Do not lie down right after eating. Do not eat 1-2 hours before bed. Do not drink beverages with caffeine. These include cola, coffee, cocoa, and tea. Do not drink alcohol. General instructions Take over-the-counter and prescription medicines only as told by your health care provider. Keep all follow-up visits. Your health care provider will want to check that any new prescribed medicines are helping your symptoms. Contact a health care provider if: Your symptoms are not controlled with medicines or lifestyle changes. You are having  trouble swallowing. You have coughing or wheezing that will not go away. Your pain is getting worse. Your pain spreads to your arms, neck, jaw, teeth, or back. You feel nauseous or you vomit. Get help right away if: You have shortness of breath. You vomit blood. You have bright red blood in your stools. You have black, tarry stools. These symptoms may be an emergency. Get help right away. Call 911. Do not wait to see if the symptoms will go away. Do not drive yourself to the hospital. Summary A hiatal hernia occurs when part of the stomach slides above the muscle that separates the abdomen from the chest. A person may be born with a weakness in the hiatus, or a weakness can develop over time. Symptoms of a hiatal hernia may include heartburn, trouble swallowing, or sore throat. Management of a hiatal hernia includes eating frequent small meals instead of three large meals a day. Get help right away if you vomit blood, have bright red blood in your stools, or have black, tarry stools. This information is not intended to replace advice given to you by your health care provider. Make sure you discuss any questions you have with your health care provider. Document Revised: 07/03/2021 Document Reviewed: 07/03/2021 Elsevier Patient Education  2024 [employer].

## 2023-09-18 NOTE — Progress Notes (Signed)
 Report to PACU, RN, vss, BBS= Clear.

## 2023-09-18 NOTE — Progress Notes (Signed)
 Pt's states no medical or surgical changes since previsit or office visit.

## 2023-09-18 NOTE — Progress Notes (Signed)
 Chief Complaint: Establish care Primary GI MD: Dr. Venice Gillis   HPI: 63 year old female history of hypertension, hypercholesterolemia, CAD s/p cardiac cath 2022, Barrett's esophagitis, tortuous colon, Lyme disease, presents to transfer care show   Follows with Dr. Berry Bristol currently has a history of stable angina since 2022 with 50% blockage in distal LAD.   Previous history of [facility] gastroenterology requesting to transfer care to Dr. Venice Gillis   Barrett's esophagus with EGD 2007, EGD 2015.  No Barrett's on 2015 EGD or EGD 12/2017.   Colonoscopy 2012 normal except for tortuous adherent colon Colonoscopy 2020 with hyperplastic polyp with repeat recommended 2030   -------------TODAY------------------------- Discussed the use of AI scribe software for clinical note transcription with the patient, who gave verbal consent to proceed.   She is transferring her care to [facility] due to insurance coverage changes following her husband's retirement from the [employer]. 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   She has a history of Barrett's esophagus diagnosed in 2007. Subsequent endoscopies in 2015 and 2019 showed no Barrett's esophagus. Her GERD is managed with Zegerid once daily, with occasional extra doses as needed. Her reflux is well-controlled with this regimen. Her most recent endoscopy in 2019 revealed a 3 cm hiatal hernia and gastritis. She continues to manage these conditions with omeprazole, which helps control her symptoms.   She experiences chronic constipation, managed with Miralax, extra strength stool softeners, and Acai Berry Green Tea Glimpse fiber pills. Constipation can block urinary flow if not managed properly and is exacerbated by frequent travel. No nausea, vomiting, changes in bowel habits, or rectal bleeding, except for occasional bleeding with hard stools attributed to hemorrhoids.   She is unsure of any family history of colon  cancer. Her husband recently retired from [employer], and she has [employer].     She would like EGD/colonoscopy   PREVIOUS GI WORKUP    Colonoscopy 12/2018 Severe diverticulosis of sigmoid and moderate diverticulosis of whole colon. 2mm and 4mm hyperplastic polyps in rectum. Very adherent and tortuous colon - repeat 12/2028   EGD 11/2017 with 3 cm hiatal hernia and gastritis (negative for H pylori and negative barretts   EGD 2015 negative barretts   EGD 2007 with barretts       Past Medical History:  Diagnosis Date   Anxiety     Barrett syndrome 2007   Coronary artery disease     DDD (degenerative disc disease), cervical     Diverticulosis     GERD (gastroesophageal reflux disease)     Heart attack (HCC)     Hiatal hernia     Hx of Lyme disease     Hypercholesteremia     Hypertension     Nephrolithiasis     Tortuous colon      redundant left colon               Past Surgical History:  Procedure Laterality Date   ABDOMINAL HYSTERECTOMY       cataracts  Bilateral      Cataracts Surgery   CHOLECYSTECTOMY       COLONOSCOPY       ESOPHAGOGASTRODUODENOSCOPY       LEFT HEART CATH AND CORONARY ANGIOGRAPHY N/A 07/10/2020    Procedure: LEFT HEART CATH AND CORONARY ANGIOGRAPHY;  Surgeon: Arty Binning, MD;  Location: [facility] INVASIVE CV LAB;  Service: Cardiovascular;  Laterality: N/A;   RETINAL DETACHMENT SURGERY Left      Retina  Repair   VAGINAL DELIVERY                    Current Outpatient Medications  Medication Sig Dispense Refill   aspirin  EC 81 MG tablet Take 1 tablet (81 mg total) by mouth daily. Swallow whole.       estradiol  (ESTRACE ) 0.1 MG/GM vaginal cream Apply a pea-sized amount 3x weekly using finger tip. 42.5 g 12   ezetimibe  (ZETIA ) 10 MG tablet Take 1 tablet (10 mg total) by mouth every other day. 90 tablet 0   lisinopril -hydrochlorothiazide  (ZESTORETIC ) 10-12.5 MG tablet Take 1 tablet by mouth every morning. 90 tablet 0   metoprolol  succinate  (TOPROL -XL) 25 MG 24 hr tablet Take 1 tablet (25 mg total) by mouth daily. 90 tablet 0   nitrofurantoin  (MACRODANTIN ) 50 MG capsule Take 1 capsule (50 mg total) by mouth at bedtime. 45 capsule 0   nitroGLYCERIN  (NITROSTAT ) 0.4 MG SL tablet Place 1 tablet (0.4 mg total) under the tongue every 5 (five) minutes as needed for chest pain. 25 tablet 6   omeprazole-sodium bicarbonate (ZEGERID) 40-1100 MG capsule Take 1 capsule by mouth daily.       rosuvastatin  (CRESTOR ) 40 MG tablet Take 1 tablet (40 mg total) by mouth daily. 90 tablet 0      No current facility-administered medications for this visit.             Allergies as of 07/17/2023 - Review Complete 07/17/2023  Allergen Reaction Noted   Chlorhexidine Rash 06/17/2020   Sulfa antibiotics Other (See Comments) 07/05/2018           Family History  Problem Relation Age of Onset   Diabetes Mother     Heart attack Father            Social History         Socioeconomic History   Marital status: Married      Spouse name: Not on file   Number of children: Not on file   Years of education: Not on file   Highest education level: Not on file  Occupational History   Not on file  Tobacco Use   Smoking status: Former      Current packs/day: 0.00      Types: Cigarettes      Quit date: 2003      Years since quitting: 22.1   Smokeless tobacco: Never  Vaping Use   Vaping status: Never Used  Substance and Sexual Activity   Alcohol use: Never   Drug use: Never   Sexual activity: Yes  Other Topics Concern   Not on file  Social History Narrative   Not on file    Social Drivers of Health    Financial Resource Strain: Not on file  Food Insecurity: Not on file  Transportation Needs: Not on file  Physical Activity: Not on file  Stress: Not on file  Social Connections: Not on file  Intimate Partner Violence: Not on file      Review of Systems:    Constitutional: No weight loss, fever, chills, weakness or fatigue HEENT: Eyes:  No change in vision               Ears, Nose, Throat:  No change in hearing or congestion Skin: No rash or itching Cardiovascular: No chest pain, chest pressure or palpitations   Respiratory: No SOB or cough Gastrointestinal: See HPI and otherwise negative Genitourinary: No dysuria or change in urinary frequency Neurological: No  headache, dizziness or syncope Musculoskeletal: No new muscle or joint pain Hematologic: No bleeding or bruising Psychiatric: No history of depression or anxiety      Physical Exam:  Vital signs: BP 128/82   Pulse 65   Ht 5' (1.524 m)   Wt 169 lb 6 oz (76.8 kg)   BMI 33.08 kg/m    Constitutional: NAD, Well developed, Well nourished, alert and cooperative Head:  Normocephalic and atraumatic. Eyes:   PEERL, EOMI. No icterus. Conjunctiva pink. Respiratory: Respirations even and unlabored. Lungs clear to auscultation bilaterally.   No wheezes, crackles, or rhonchi.  Cardiovascular:  Regular rate and rhythm. No peripheral edema, cyanosis or pallor.  Gastrointestinal:  Soft, nondistended, nontender. No rebound or guarding. Normal bowel sounds. No appreciable masses or hepatomegaly. Rectal:  Not performed.  Msk:  Symmetrical without gross deformities. Without edema, no deformity or joint abnormality.  Neurologic:  Alert and  oriented x4;  grossly normal neurologically.  Skin:   Dry and intact without significant lesions or rashes. Psychiatric: Oriented to person, place and time. Demonstrates good judgement and reason without abnormal affect or behaviors.     RELEVANT LABS AND IMAGING: CBC Labs (Brief)          Component Value Date/Time    WBC 7.3 06/17/2020 1955    RBC 4.57 06/17/2020 1955    HGB 13.4 06/17/2020 1955    HCT 40.0 06/17/2020 1955    PLT 239 06/17/2020 1955    MCV 87.5 06/17/2020 1955    MCH 29.3 06/17/2020 1955    MCHC 33.5 06/17/2020 1955    RDW 13.4 06/17/2020 1955    LYMPHSABS 2.7 06/17/2020 1955    MONOABS 0.9 06/17/2020 1955     EOSABS 0.3 06/17/2020 1955    BASOSABS 0.1 06/17/2020 1955        CMP     Labs (Brief)          Component Value Date/Time    NA 136 01/15/2022 1045    K 4.1 01/15/2022 1045    CL 97 01/15/2022 1045    CO2 23 01/15/2022 1045    GLUCOSE 87 01/15/2022 1045    GLUCOSE 101 (H) 06/17/2020 1955    BUN 12 01/15/2022 1045    CREATININE 0.79 01/15/2022 1045    CALCIUM  10.2 01/15/2022 1045    PROT 6.5 05/21/2022 0723    ALBUMIN 4.5 05/21/2022 0723    AST 20 05/21/2022 0723    ALT 25 05/21/2022 0723    ALKPHOS 54 05/21/2022 0723    BILITOT 0.3 05/21/2022 0723    GFRNONAA >60 06/17/2020 1955    GFRAA >60 07/05/2018 1842          Assessment/Plan:       Gastroesophageal Reflux Disease (GERD) with history of Barrett's Esophagus Controlled with Zegerid once daily. History of Barrett's esophagus, but recent endoscopies in 2015 and 2019 were negative. Patient also has a 3cm hiatal hernia contributing to reflux. -Continue Zegerid once daily - Educated patient on lifestyle modifications -- Plan for repeat endoscopy to monitor for recurrence of Barrett's esophagus. -- I thoroughly discussed the procedure with the patient (at bedside) to include nature of the procedure, alternatives, benefits, and risks (including but not limited to bleeding, infection, perforation, anesthesia/cardiac pulmonary complications).  Patient verbalized understanding and gave verbal consent to proceed with procedure.    Chronic Constipation Patient reports needing to take a combination of Miralax, stool softeners, and fiber supplements to maintain regular bowel movements. --Continue current regimen as  needed. --Consider adding regular Miralax for more consistent bowel movements if needed. - Can proceed with colonoscopy for further evaluation per patient request   Colon Polyps and Diverticulosis Last colonoscopy in August 2020 showed hyperplastic polyps and diverticulosis. No family history of colon cancer. Original  plan for repeat 2030. Patient requesting colonoscopy. -Plan for repeat colonoscopy for screening and evaluation of constipation. -Continue current bowel regimen and increase fiber intake to prevent diverticulitis. - I thoroughly discussed the procedure with the patient (at bedside) to include nature of the procedure, alternatives, benefits, and risks (including but not limited to bleeding, infection, perforation, anesthesia/cardiac pulmonary complications).  Patient verbalized understanding and gave verbal consent to proceed with procedure.    General Health Maintenance -Schedule endoscopy and colonoscopy after patient's planned travel in April.      Suzanna Erp, PA-C   Attending physician's note   I have taken history, reviewed the chart and examined the patient. I performed a substantive portion of this encounter, including complete performance of at least one of the key components, in conjunction with the APP. I agree with the Advanced Practitioner's note, impression and recommendations.   For EGD/colon today   Magnus Schuller, MD [facility] [phone]

## 2023-09-18 NOTE — Progress Notes (Signed)
 Called to room to assist during endoscopic procedure.  Patient ID and intended procedure confirmed with present staff. Received instructions for my participation in the procedure from the performing physician.

## 2023-09-18 NOTE — Op Note (Signed)
  [facility] Patient Name: Lauren Mcneil Procedure Date: 09/18/2023 1:48 PM MRN: [id number] Endoscopist: Lajuan Pila , MD, [id number] Age: 63 Referring MD:  Date of Birth: 08-25-1960 Gender: Female Account #: [id number] Procedure:                Upper GI endoscopy Indications:              Heartburn. Prev ?Barretts. Medicines:                Monitored Anesthesia Care Procedure:                Pre-Anesthesia Assessment:                           - Prior to the procedure, a History and Physical                            was performed, and patient medications and                            allergies were reviewed. The patient's tolerance of                            previous anesthesia was also reviewed. The risks                            and benefits of the procedure and the sedation                            options and risks were discussed with the patient.                            All questions were answered, and informed consent                            was obtained. Prior Anticoagulants: The patient has                            taken no anticoagulant or antiplatelet agents. ASA                            Grade Assessment: II - A patient with mild systemic                            disease. After reviewing the risks and benefits,                            the patient was deemed in satisfactory condition to                            undergo the procedure.                           After obtaining informed consent, the endoscope was  passed under direct vision. Throughout the                            procedure, the patient's blood pressure, pulse, and                            oxygen saturations were monitored continuously. The                            Olympus Scope [id number] was introduced through the                            mouth, and advanced to the second part of duodenum.                            The upper GI endoscopy was  accomplished without                            difficulty. The patient tolerated the procedure                            well. Scope In: Scope Out: Findings:                 The examined esophagus was torturous but normal.                           A non-obstructing and mild Schatzki ring was found                            at the gastroesophageal junction at 35 cm. Biopsies                            were taken with a cold forceps for histology from                            all 4 quads.                           A 3 cm hiatal hernia was present.                           The entire examined stomach was normal.                           The in the duodenum was normal.                           The exam was otherwise without abnormality. Complications:            No immediate complications. Estimated Blood Loss:     Estimated blood loss: none. Impression:               - Non-obstructing and mild Schatzki ring. Biopsied.                           -  3 cm hiatal hernia.                           - The examination was otherwise normal. No Barretts. Recommendation:           - Patient has a contact number available for                            emergencies. The signs and symptoms of potential                            delayed complications were discussed with the                            patient. Return to normal activities tomorrow.                            Written discharge instructions were provided to the                            patient.                           - Resume previous diet.                           - Continue present medications.                           - Await pathology results.                           - The findings and recommendations were discussed                            with the patient's family. Lajuan Pila, MD 09/18/2023 2:16:57 PM This report has been signed electronically.

## 2023-09-18 NOTE — Op Note (Signed)
 [facility] Patient Name: Lauren Mcneil Procedure Date: 09/18/2023 1:40 PM MRN: [id number] Endoscopist: Lajuan Pila , MD, [id number] Age: 63 Referring MD:  Date of Birth: 1960/11/09 Gender: Female Account #: [id number] Procedure:                Colonoscopy Indications:              Screening for colorectal malignant neoplasm. Chr                            constipation. Medicines:                Monitored Anesthesia Care Procedure:                Pre-Anesthesia Assessment:                           - Prior to the procedure, a History and Physical                            was performed, and patient medications and                            allergies were reviewed. The patient's tolerance of                            previous anesthesia was also reviewed. The risks                            and benefits of the procedure and the sedation                            options and risks were discussed with the patient.                            All questions were answered, and informed consent                            was obtained. Prior Anticoagulants: The patient has                            taken no anticoagulant or antiplatelet agents. ASA                            Grade Assessment: II - A patient with mild systemic                            disease. After reviewing the risks and benefits,                            the patient was deemed in satisfactory condition to                            undergo the procedure.  After obtaining informed consent, the colonoscope                            was passed under direct vision. Throughout the                            procedure, the patient's blood pressure, pulse, and                            oxygen saturations were monitored continuously. The                            Olympus Scope [id number] was introduced through the                            anus and advanced to the the cecum, identified  by                            appendiceal orifice and ileocecal valve. The                            colonoscopy was performed without difficulty. The                            patient tolerated the procedure well. The quality                            of the bowel preparation was good. The ileocecal                            valve, appendiceal orifice, and rectum were                            photographed. Scope In: 1:59:20 PM Scope Out: 2:14:43 PM Scope Withdrawal Time: 0 hours 9 minutes 20 seconds  Total Procedure Duration: 0 hours 15 minutes 23 seconds  Findings:                 A 2 mm polyp was found in the cecum. The polyp was                            sessile. The polyp was removed with a cold biopsy                            forceps. Resection and retrieval were complete.                           Multiple medium-mouthed diverticula were found in                            the sigmoid colon, several in descending colon,                            transverse colon and ascending colon.  A diffuse area of moderate melanosis was found in                            the entire colon.                           Non-bleeding internal hemorrhoids were found during                            retroflexion. The hemorrhoids were moderate and                            Grade II (internal hemorrhoids that prolapse but                            reduce spontaneously).                           The exam was otherwise without abnormality on                            direct and retroflexion views. The colon was highly                            torturous. Complications:            No immediate complications. Estimated Blood Loss:     Estimated blood loss: none. Impression:               - One 2 mm polyp in the cecum, removed with a cold                            biopsy forceps. Resected and retrieved.                           - Diverticulosis in the sigmoid  colon, in the                            descending colon, in the transverse colon and in                            the ascending colon. Most severe in sigmoid.                           - Melanosis in the colon.                           - Non-bleeding internal hemorrhoids.                           - The examination was otherwise normal on direct                            and retroflexion views. Recommendation:           - Patient has a contact number available for  emergencies. The signs and symptoms of potential                            delayed complications were discussed with the                            patient. Return to normal activities tomorrow.                            Written discharge instructions were provided to the                            patient.                           - Resume previous diet.                           - Continue present medications.                           - Miralax 1 capful (17 grams) in 8 ounces of water                            PO daily.                           - Await pathology results.                           - Repeat colonoscopy date to be determined after                            pending pathology results are reviewed for                            surveillance.                           - The findings and recommendations were discussed                            with the patient's family. Lajuan Pila, MD 09/18/2023 2:21:23 PM This report has been signed electronically.

## 2023-09-19 ENCOUNTER — Telehealth: Payer: Self-pay | Admitting: *Deleted

## 2023-09-19 NOTE — Telephone Encounter (Signed)
  Follow up Call-     09/18/2023    1:00 PM  Call back number  Post procedure Call Back phone  # [phone]  Permission to leave phone message Yes     Patient questions:   Message left to call us  if necessary.

## 2023-09-23 LAB — SURGICAL PATHOLOGY

## 2023-10-06 ENCOUNTER — Ambulatory Visit: Payer: Self-pay | Admitting: Gastroenterology

## 2023-10-09 ENCOUNTER — Ambulatory Visit: Admitting: Urology

## 2023-10-24 ENCOUNTER — Ambulatory Visit: Admitting: Urology

## 2023-10-24 ENCOUNTER — Encounter: Payer: Self-pay | Admitting: Urology

## 2023-10-24 VITALS — BP 131/75 | HR 80 | Ht 60.0 in | Wt 165.0 lb

## 2023-10-24 DIAGNOSIS — N200 Calculus of kidney: Secondary | ICD-10-CM

## 2023-10-24 DIAGNOSIS — Z09 Encounter for follow-up examination after completed treatment for conditions other than malignant neoplasm: Secondary | ICD-10-CM

## 2023-10-24 DIAGNOSIS — Z87440 Personal history of urinary (tract) infections: Secondary | ICD-10-CM

## 2023-10-24 DIAGNOSIS — Z87442 Personal history of urinary calculi: Secondary | ICD-10-CM | POA: Diagnosis not present

## 2023-10-24 LAB — URINALYSIS, ROUTINE W REFLEX MICROSCOPIC
Bilirubin, UA: NEGATIVE
Glucose, UA: NEGATIVE
Ketones, UA: NEGATIVE
Nitrite, UA: NEGATIVE
Protein,UA: NEGATIVE
Specific Gravity, UA: 1.015 (ref 1.005–1.030)
Urobilinogen, Ur: 0.2 mg/dL (ref 0.2–1.0)
pH, UA: 7 (ref 5.0–7.5)

## 2023-10-24 LAB — MICROSCOPIC EXAMINATION

## 2023-10-24 NOTE — Progress Notes (Signed)
 Assessment: 1. History of UTI   2. Nephrolithiasis     Plan: I personally reviewed the patient's chart including provider notes, lab and imaging results. Continue methods to reduce the risk of UTIs including timed and double voiding, increase fluid intake, daily cranberry supplement, daily probiotic, and vaginal hormone cream. Return to office in 6 months.  Chief Complaint: Chief Complaint  Patient presents with   Nephrolithiasis    HPI: Lauren Mcneil is a 63 y.o. female who presents for continued evaluation of nephrolithiasis and recurrent UTI.   She was initially seen by Dr. Del Favia in January 2025 for evaluation. She reported frequent recurrent uncomplicated UTIs for at least the last 10 years.  The UTIs began after she underwent a TAH/BSO for abnormal uterine bleeding in 2012.  She was evaluated by urology in [place] around 2014 and underwent cystoscopy which was reportedly normal.  She completed a course of Cipro  and Macrobid  for a UTI in December 2024.   Patient also has a long history of recurrent nephrolithiasis.  She reported passing a stone at least once a year. No prior need for surgical intervention.   CT abdomen and pelvis without contrast from 06/16/2023 showed no renal or ureteral calculi, no renal mass, or evidence of obstruction.  She returns today for follow-up.  She is doing very well.  She has been off the daily antibiotic for 6-8 weeks.  No current UTI symptoms.  No dysuria or gross hematuria.  Portions of the above documentation were copied from a prior visit for review purposes only.  Allergies: Allergies  Allergen Reactions   Chlorhexidine Rash   Sulfa Antibiotics Other (See Comments)    Burning sensation     PMH: Past Medical History:  Diagnosis Date   Anxiety    Barrett syndrome 2007   Coronary artery disease    DDD (degenerative disc disease), cervical    Diverticulosis    GERD (gastroesophageal reflux disease)    Heart attack (HCC)     Hiatal hernia    Hx of Lyme disease    Hypercholesteremia    Hypertension    Nephrolithiasis    Tortuous colon    redundant left colon    PSH: Past Surgical History:  Procedure Laterality Date   ABDOMINAL HYSTERECTOMY     cataracts  Bilateral    Cataracts Surgery   CHOLECYSTECTOMY     COLONOSCOPY     ESOPHAGOGASTRODUODENOSCOPY     LEFT HEART CATH AND CORONARY ANGIOGRAPHY N/A 07/10/2020   Procedure: LEFT HEART CATH AND CORONARY ANGIOGRAPHY;  Surgeon: Arty Binning, MD;  Location: [facility];  Service: Cardiovascular;  Laterality: N/A;   RETINAL DETACHMENT SURGERY Left    Retina Repair   VAGINAL DELIVERY      SH: Social History   Tobacco Use   Smoking status: Former    Current packs/day: 0.00    Types: Cigarettes    Quit date: 2003    Years since quitting: 22.4   Smokeless tobacco: Never  Vaping Use   Vaping status: Never Used  Substance Use Topics   Alcohol use: Never   Drug use: Never    ROS: Constitutional:  Negative for fever, chills, weight loss CV: Negative for chest pain, previous MI, hypertension Respiratory:  Negative for shortness of breath, wheezing, sleep apnea, frequent cough GI:  Negative for nausea, vomiting, bloody stool, GERD  PE: BP 131/75   Pulse 80   Ht 5' (1.524 m)   Wt 165 lb (74.8  kg)   BMI 32.22 kg/m  GENERAL APPEARANCE:  Well appearing, well developed, well nourished, NAD HEENT:  Atraumatic, normocephalic, oropharynx clear NECK:  Supple without lymphadenopathy or thyromegaly ABDOMEN:  Soft, non-tender, no masses EXTREMITIES:  Moves all extremities well, without clubbing, cyanosis, or edema NEUROLOGIC:  Alert and oriented x 3, normal gait, CN II-XII grossly intact MENTAL STATUS:  appropriate BACK:  Non-tender to palpation, No CVAT SKIN:  Warm, dry, and intact   Results: U/A: 0-5 WBCs, 0-2 RBCs

## 2024-01-23 ENCOUNTER — Ambulatory Visit
Admission: RE | Admit: 2024-01-23 | Discharge: 2024-01-23 | Disposition: A | Discharge status: Home / Self Care | Source: Ambulatory Visit | Attending: Family Medicine | Admitting: Family Medicine

## 2024-01-23 VITALS — BP 138/79 | HR 72 | Temp 98.7°F | Resp 16

## 2024-01-23 DIAGNOSIS — N10 Acute tubulo-interstitial nephritis: Secondary | ICD-10-CM | POA: Insufficient documentation

## 2024-01-23 DIAGNOSIS — N3001 Acute cystitis with hematuria: Secondary | ICD-10-CM | POA: Diagnosis present

## 2024-01-23 LAB — POCT URINE DIPSTICK
Bilirubin, UA: NEGATIVE
Glucose, UA: NEGATIVE mg/dL
Ketones, POC UA: NEGATIVE mg/dL
Nitrite, UA: POSITIVE — AB
POC PROTEIN,UA: NEGATIVE
Spec Grav, UA: 1.015 (ref 1.010–1.025)
Urobilinogen, UA: 0.2 U/dL
pH, UA: 6 (ref 5.0–8.0)

## 2024-01-23 MED ORDER — CEPHALEXIN 500 MG PO CAPS: 500.0000 mg | ORAL_CAPSULE | Freq: Four times a day (QID) | ORAL | 0 refills | Status: AC

## 2024-01-23 MED ORDER — CEFTRIAXONE SODIUM 1 G IJ SOLR
1.0000 g | Freq: Once | INTRAMUSCULAR | Status: AC
  Administered 2024-01-23: 1 g via INTRAMUSCULAR

## 2024-01-23 NOTE — ED Triage Notes (Signed)
 Pt present with fever, lower back pain x 4 days. States she has burning when voiding. Reports she want camping and was dehydrated. Pt states she has a bad odor in her urine.

## 2024-01-23 NOTE — Discharge Instructions (Addendum)
 The clinic will contact you with results of the urine culture done today if positive.  Start Keflex  4 times a day for 7 days.  Lots of rest and fluids.  Please follow-up with your PCP if your symptoms do not improve.  Please go to the ER if you develop any worsening symptoms.  Hope you feel better soon!

## 2024-01-23 NOTE — ED Provider Notes (Signed)
 [facility]    CSN: [id number] Arrival date & time: 01/23/24  1314      History   Chief Complaint Chief Complaint  Patient presents with   Fever    Urinary tract infection - Entered by patient    HPI Lauren Mcneil is a 63 y.o. female presents for dysuria.  Patient reports 4 days of urinary burning with abdominal cramping, subjective fevers, flank pain.  Denies any nausea/vomiting, hematuria.  No vaginal discharge or STD concern.  Has had UTIs in the past as well as a kidney infection and states this feels similar to both.  She did go camping prior to onset and was not very hydrated.  No OTC treatments have been used since onset.  No other concerns at this time.   Fever Associated symptoms: dysuria     Past Medical History:  Diagnosis Date   Anxiety    Barrett syndrome 2007   Coronary artery disease    DDD (degenerative disc disease), cervical    Diverticulosis    GERD (gastroesophageal reflux disease)    Heart attack (HCC)    Hiatal hernia    Hx of Lyme disease    Hypercholesteremia    Hypertension    Nephrolithiasis    Tortuous colon    redundant left colon    Patient Active Problem List   Diagnosis Date Noted   Angina pectoris (HCC) 07/09/2020   CAD (coronary artery disease), native coronary artery 07/09/2020   Hyperlipidemia with target LDL less than 70 07/09/2020   Primary hypertension 07/09/2020    Past Surgical History:  Procedure Laterality Date   ABDOMINAL HYSTERECTOMY     cataracts  Bilateral    Cataracts Surgery   CHOLECYSTECTOMY     COLONOSCOPY     ESOPHAGOGASTRODUODENOSCOPY     LEFT HEART CATH AND CORONARY ANGIOGRAPHY N/A 07/10/2020   Procedure: LEFT HEART CATH AND CORONARY ANGIOGRAPHY;  Surgeon: Claudene Victory LELON, MD;  Location: [facility];  Service: Cardiovascular;  Laterality: N/A;   RETINAL DETACHMENT SURGERY Left    Retina Repair   VAGINAL DELIVERY      OB History     Gravida      Para      Term      Preterm       AB      Living  3      SAB      IAB      Ectopic      Multiple      Live Births               Home Medications    Prior to Admission medications   Medication Sig Start Date End Date Taking? Authorizing Provider  cephALEXin  (KEFLEX ) 500 MG capsule Take 1 capsule (500 mg total) by mouth 4 (four) times daily for 7 days. 01/23/24 01/30/24 Yes Vladimir Lenhoff, Jodi R, NP  ALPRAZolam (XANAX) 0.5 MG tablet 1 tablet Orally Once a day for 30 days 07/22/23   [provider]  aspirin  EC 81 MG tablet Take 1 tablet (81 mg total) by mouth daily. Swallow whole. 01/02/22   Claudene Victory LELON, MD  estradiol  (ESTRACE ) 0.1 MG/GM vaginal cream Apply a pea-sized amount 3x weekly using finger tip. 06/10/23   Shona Layman BROCKS, MD  ezetimibe  (ZETIA ) 10 MG tablet Take 1 tablet (10 mg total) by mouth every other day. 06/17/23 09/18/23  Ladona Heinz, MD  lisinopril -hydrochlorothiazide  (ZESTORETIC ) 10-12.5 MG tablet TAKE 1 TABLET BY  MOUTH EVERY DAY IN THE MORNING 09/12/23   Ladona Heinz, MD  metoprolol  succinate (TOPROL -XL) 25 MG 24 hr tablet Take 1 tablet (25 mg total) by mouth daily. 06/17/23   Ladona Heinz, MD  nitroGLYCERIN  (NITROSTAT ) 0.4 MG SL tablet Place 1 tablet (0.4 mg total) under the tongue every 5 (five) minutes as needed for chest pain. 06/17/23   Ladona Heinz, MD  omeprazole-sodium bicarbonate (ZEGERID) 40-1100 MG capsule Take 1 capsule by mouth daily. 04/11/20   [provider]  rosuvastatin  (CRESTOR ) 40 MG tablet Take 1 tablet (40 mg total) by mouth daily. 06/17/23   Ladona Heinz, MD    Family History Family History  Problem Relation Age of Onset   Diabetes Mother    Heart attack Father    Colon cancer Neg Hx    Colon polyps Neg Hx    Esophageal cancer Neg Hx    Rectal cancer Neg Hx    Stomach cancer Neg Hx     Social History Social History   Tobacco Use   Smoking status: Former    Current packs/day: 0.00    Types: Cigarettes    Quit date: 2003    Years since quitting: 22.6   Smokeless  tobacco: Never  Vaping Use   Vaping status: Never Used  Substance Use Topics   Alcohol use: Never   Drug use: Never     Allergies   Chlorhexidine and Sulfa antibiotics   Review of Systems Review of Systems  Constitutional:  Positive for fever.  Genitourinary:  Positive for dysuria and flank pain.     Physical Exam Triage Vital Signs ED Triage Vitals  Encounter Vitals Group     BP 01/23/24 1415 138/79     Girls Systolic BP Percentile --      Girls Diastolic BP Percentile --      Boys Systolic BP Percentile --      Boys Diastolic BP Percentile --      Pulse Rate 01/23/24 1415 72     Resp 01/23/24 1415 16     Temp 01/23/24 1415 98.7 F (37.1 C)     Temp Source 01/23/24 1415 Oral     SpO2 01/23/24 1415 95 %     Weight --      Height --      Head Circumference --      Peak Flow --      Pain Score 01/23/24 1413 5     Pain Loc --      Pain Education --      Exclude from Growth Chart --    No data found.  Updated Vital Signs BP 138/79 (BP Location: Left Arm)   Pulse 72   Temp 98.7 F (37.1 C) (Oral)   Resp 16   SpO2 95%   Visual Acuity Right Eye Distance:   Left Eye Distance:   Bilateral Distance:    Right Eye Near:   Left Eye Near:    Bilateral Near:     Physical Exam Vitals and nursing note reviewed.  Constitutional:      Appearance: Normal appearance.  HENT:     Head: Normocephalic and atraumatic.  Eyes:     Pupils: Pupils are equal, round, and reactive to light.  Cardiovascular:     Rate and Rhythm: Normal rate.  Pulmonary:     Effort: Pulmonary effort is normal.  Abdominal:     Tenderness: There is right CVA tenderness and left CVA tenderness.  Skin:  General: Skin is warm and dry.  Neurological:     General: No focal deficit present.     Mental Status: She is alert and oriented to person, place, and time.  Psychiatric:        Mood and Affect: Mood normal.        Behavior: Behavior normal.      UC Treatments / Results   Labs (all labs ordered are listed, but only abnormal results are displayed) Labs Reviewed  POCT URINE DIPSTICK - Abnormal; Notable for the following components:      Result Value   Clarity, UA cloudy (*)    Blood, UA small (*)    Nitrite, UA Positive (*)    Leukocytes, UA Small (1+) (*)    All other components within normal limits  URINE CULTURE    EKG   Radiology No results found.  Procedures Procedures (including critical care time)  Medications Ordered in UC Medications  cefTRIAXone  (ROCEPHIN ) injection 1 g (has no administration in time range)    Initial Impression / Assessment and Plan / UC Course  I have reviewed the triage vital signs and the nursing notes.  Pertinent labs & imaging results that were available during my care of the patient were reviewed by me and considered in my medical decision making (see chart for details).     Reviewed exam and symptoms with patient.  No red flags.  She is afebrile in clinic but does have flank pain with a positive UA.  Will cover for pyelonephritis.  Patient given IM ceftriaxone  in clinic.  Will do Keflex  4 times daily for 7 days.  Encourage rest fluids and PCP follow-up if symptoms do not improve.  ER precautions reviewed. Final Clinical Impressions(s) / UC Diagnoses   Final diagnoses:  Acute cystitis with hematuria  Acute pyelonephritis     Discharge Instructions      The clinic will contact you with results of the urine culture done today if positive.  Start Keflex  4 times a day for 7 days.  Lots of rest and fluids.  Please follow-up with your PCP if your symptoms do not improve.  Please go to the ER if you develop any worsening symptoms.  Hope you feel better soon!     ED Prescriptions     Medication Sig Dispense Auth. Provider   cephALEXin  (KEFLEX ) 500 MG capsule Take 1 capsule (500 mg total) by mouth 4 (four) times daily for 7 days. 28 capsule Lynden Carrithers, Jodi R, NP      PDMP not reviewed this encounter.    Loreda Myla SAUNDERS, NP 01/23/24 1434

## 2024-01-26 ENCOUNTER — Ambulatory Visit: Payer: Self-pay

## 2024-01-26 LAB — URINE CULTURE: Culture: >=100000 — AB

## 2024-04-28 ENCOUNTER — Encounter: Payer: Self-pay | Admitting: Urology

## 2024-04-28 ENCOUNTER — Ambulatory Visit: Admitting: Urology

## 2024-04-28 VITALS — BP 119/84 | HR 78 | Ht 60.0 in | Wt 160.0 lb

## 2024-04-28 DIAGNOSIS — Z87440 Personal history of urinary (tract) infections: Secondary | ICD-10-CM | POA: Diagnosis not present

## 2024-04-28 DIAGNOSIS — R8290 Unspecified abnormal findings in urine: Secondary | ICD-10-CM | POA: Diagnosis not present

## 2024-04-28 DIAGNOSIS — Z87442 Personal history of urinary calculi: Secondary | ICD-10-CM

## 2024-04-28 LAB — URINALYSIS, ROUTINE W REFLEX MICROSCOPIC
Bilirubin, UA: NEGATIVE
Glucose, UA: NEGATIVE
Nitrite, UA: NEGATIVE
Protein,UA: NEGATIVE
RBC, UA: NEGATIVE
Specific Gravity, UA: 1.02 (ref 1.005–1.030)
Urobilinogen, Ur: 0.2 mg/dL (ref 0.2–1.0)
pH, UA: 5.5 (ref 5.0–7.5)

## 2024-04-28 LAB — MICROSCOPIC EXAMINATION

## 2024-04-28 MED ORDER — NITROFURANTOIN MONOHYD MACRO 100 MG PO CAPS: 100.0000 mg | ORAL_CAPSULE | Freq: Two times a day (BID) | ORAL | 0 refills | Status: AC

## 2024-04-28 NOTE — Progress Notes (Signed)
 Assessment: 1. History of UTI   2. History of nephrolithiasis   3. Abnormal urine findings     Plan: Continue methods to reduce the risk of UTIs including timed and double voiding, increase fluid intake, daily cranberry supplement, daily probiotic, and vaginal hormone cream. Urine culture sent today Rx for Macrobid  x 7 days provided. Return to office in 6 months  Chief Complaint: Chief Complaint  Patient presents with   History of UTI    HPI: Lauren Mcneil is a 63 y.o. female who presents for continued evaluation of history of nephrolithiasis and recurrent UTI.   She was initially seen by Dr. Shona in January 2025 for evaluation. She reported frequent recurrent uncomplicated UTIs for at least the last 10 years.  The UTIs began after she underwent a TAH/BSO for abnormal uterine bleeding in 2012.  She was evaluated by urology in [place] around 2014 and underwent cystoscopy which was reportedly normal.  She completed a course of Cipro  and Macrobid  for a UTI in December 2024.   Patient also has a long history of recurrent nephrolithiasis.  She reported passing a stone at least once a year. No prior need for surgical intervention.   CT abdomen and pelvis without contrast from 06/16/2023 showed no renal or ureteral calculi, no renal mass, or evidence of obstruction.  At her visit in June 2025, she was doing very well.  She had been off the daily antibiotic for 6-8 weeks.  No recent UTI symptoms.  No dysuria or gross hematuria.  She was diagnosed with a UTI in September 2025. Urine culture grew >100 K E. coli.  Treated with cephalexin .  She returns today for follow-up.  She has noted some cloudy urine this week.  She had 1 episode of dysuria.  No flank pain or gross hematuria.  No fevers or chills.  She continues on Estrace  vaginal cream.  Portions of the above documentation were copied from a prior visit for review purposes only.  Allergies: Allergies  Allergen Reactions    Chlorhexidine Rash   Sulfa Antibiotics Other (See Comments)    Burning sensation     PMH: Past Medical History:  Diagnosis Date   Anxiety    Barrett syndrome 2007   Coronary artery disease    DDD (degenerative disc disease), cervical    Diverticulosis    GERD (gastroesophageal reflux disease)    Heart attack (HCC)    Hiatal hernia    Hx of Lyme disease    Hypercholesteremia    Hypertension    Nephrolithiasis    Tortuous colon    redundant left colon    PSH: Past Surgical History:  Procedure Laterality Date   ABDOMINAL HYSTERECTOMY     cataracts  Bilateral    Cataracts Surgery   CHOLECYSTECTOMY     COLONOSCOPY     ESOPHAGOGASTRODUODENOSCOPY     LEFT HEART CATH AND CORONARY ANGIOGRAPHY N/A 07/10/2020   Procedure: LEFT HEART CATH AND CORONARY ANGIOGRAPHY;  Surgeon: Claudene Victory LELON, MD;  Location: [facility];  Service: Cardiovascular;  Laterality: N/A;   RETINAL DETACHMENT SURGERY Left    Retina Repair   VAGINAL DELIVERY      SH: Social History   Tobacco Use   Smoking status: Former    Current packs/day: 0.00    Types: Cigarettes    Quit date: 2003    Years since quitting: 22.9   Smokeless tobacco: Never  Vaping Use   Vaping status: Never Used  Substance Use  Topics   Alcohol use: Never   Drug use: Never    ROS: Constitutional:  Negative for fever, chills, weight loss CV: Negative for chest pain, previous MI, hypertension Respiratory:  Negative for shortness of breath, wheezing, sleep apnea, frequent cough GI:  Negative for nausea, vomiting, bloody stool, GERD  PE: BP 119/84   Pulse 78   Ht 5' (1.524 m)   Wt 160 lb (72.6 kg)   BMI 31.25 kg/m  GENERAL APPEARANCE:  Well appearing, well developed, well nourished, NAD HEENT:  Atraumatic, normocephalic, oropharynx clear NECK:  Supple without lymphadenopathy or thyromegaly ABDOMEN:  Soft, non-tender, no masses EXTREMITIES:  Moves all extremities well, without clubbing, cyanosis, or  edema NEUROLOGIC:  Alert and oriented x 3, normal gait, CN II-XII grossly intact MENTAL STATUS:  appropriate BACK:  Non-tender to palpation, No CVAT SKIN:  Warm, dry, and intact   Results: U/A: 0-5 WBCs, 0-2 RBCs, 0-10 epis, many bacteria, nitrite negative

## 2024-05-05 ENCOUNTER — Encounter: Payer: Self-pay | Admitting: Urology

## 2024-05-05 LAB — URINE CULTURE

## 2024-05-31 ENCOUNTER — Ambulatory Visit
Admission: RE | Admit: 2024-05-31 | Discharge: 2024-05-31 | Disposition: A | Discharge status: Home / Self Care | Source: Ambulatory Visit | Attending: Family Medicine | Admitting: Family Medicine

## 2024-05-31 VITALS — BP 139/87 | HR 87 | Temp 99.3°F | Resp 17 | Ht 60.0 in | Wt 161.0 lb

## 2024-05-31 DIAGNOSIS — N3001 Acute cystitis with hematuria: Secondary | ICD-10-CM | POA: Diagnosis not present

## 2024-05-31 DIAGNOSIS — R350 Frequency of micturition: Secondary | ICD-10-CM | POA: Diagnosis not present

## 2024-05-31 LAB — POCT URINE DIPSTICK
Bilirubin, UA: NEGATIVE
Glucose, UA: NEGATIVE mg/dL
Nitrite, UA: NEGATIVE
POC PROTEIN,UA: NEGATIVE
Spec Grav, UA: 1.015
Urobilinogen, UA: 0.2 U/dL
pH, UA: 6

## 2024-05-31 MED ORDER — CEPHALEXIN 500 MG PO CAPS: 500.0000 mg | ORAL_CAPSULE | Freq: Two times a day (BID) | ORAL | 0 refills | Status: AC

## 2024-05-31 NOTE — Discharge Instructions (Signed)
 Please start cephalexin  to address an urinary tract infection. Make sure you hydrate very well with plain water and a quantity of 80 ounces of water a day.  Please limit drinks that are considered urinary irritants such as fruit juices, soda, sweet tea, coffee, artifical sweetened drinks, energy drinks, alcohol.  These can worsen your urinary and genital symptoms but also be the source of them.  I will let you know about your urine culture results through MyChart to see if we need to prescribe or change your antibiotics based off of those results.

## 2024-05-31 NOTE — ED Provider Notes (Signed)
 " [facility]  Note:  This document was prepared using Conservation officer, historic buildings and may include unintentional dictation errors.  MRN: [id number] DOB: 1960/09/28  Subjective:   TEPHANIE Mcneil is a 64 y.o. female presenting for 3-day history of dysuria, malodorous urine, low back pain, pelvic pain and fever.  Has had a history of recurrent UTIs over the past few years since having a hysterectomy.  Has a history of nephrolithiasis as well.  Tries to well-hydrated.  She does see an urologist.  Current Outpatient Medications  Medication Instructions   ALPRAZolam (XANAX) 0.5 MG tablet 1 tablet Orally Once a day for 30 days   aspirin  EC 81 mg, Oral, Daily, Swallow whole.   estradiol  (ESTRACE ) 0.1 MG/GM vaginal cream Apply a pea-sized amount 3x weekly using finger tip.   ezetimibe  (ZETIA ) 10 mg, Oral, Every other day   lisinopril -hydrochlorothiazide  (ZESTORETIC ) 10-12.5 MG tablet TAKE 1 TABLET BY MOUTH EVERY DAY IN THE MORNING   metoprolol  succinate (TOPROL -XL) 25 mg, Oral, Daily   nitroGLYCERIN  (NITROSTAT ) 0.4 mg, Sublingual, Every 5 min PRN   omeprazole-sodium bicarbonate (ZEGERID) 40-1100 MG capsule 1 capsule, Daily   rosuvastatin  (CRESTOR ) 40 mg, Oral, Daily    Allergies[1]  Past Medical History:  Diagnosis Date   Anxiety    Barrett syndrome 2007   Coronary artery disease    DDD (degenerative disc disease), cervical    Diverticulosis    GERD (gastroesophageal reflux disease)    Heart attack (HCC)    Hiatal hernia    Hx of Lyme disease    Hypercholesteremia    Hypertension    Nephrolithiasis    Tortuous colon    redundant left colon     Past Surgical History:  Procedure Laterality Date   ABDOMINAL HYSTERECTOMY     cataracts  Bilateral    Cataracts Surgery   CHOLECYSTECTOMY     COLONOSCOPY     ESOPHAGOGASTRODUODENOSCOPY     LEFT HEART CATH AND CORONARY ANGIOGRAPHY N/A 07/10/2020   Procedure: LEFT HEART CATH AND CORONARY ANGIOGRAPHY;   Surgeon: Claudene Victory LELON, MD;  Location: [facility];  Service: Cardiovascular;  Laterality: N/A;   RETINAL DETACHMENT SURGERY Left    Retina Repair   VAGINAL DELIVERY      Family History  Problem Relation Age of Onset   Diabetes Mother    Heart attack Father    Colon cancer Neg Hx    Colon polyps Neg Hx    Esophageal cancer Neg Hx    Rectal cancer Neg Hx    Stomach cancer Neg Hx     Social History   Occupational History   Not on file  Tobacco Use   Smoking status: Former    Current packs/day: 0.00    Types: Cigarettes    Quit date: 2003    Years since quitting: 23.0   Smokeless tobacco: Never  Vaping Use   Vaping status: Never Used  Substance and Sexual Activity   Alcohol use: Never   Drug use: Never   Sexual activity: Yes    Birth control/protection: Post-menopausal, Surgical     ROS   Objective:   Vitals: BP 139/87 (BP Location: Right Arm)   Pulse 87   Temp 99.3 F (37.4 C) (Oral)   Resp 17   Ht 5' (1.524 m)   Wt 161 lb (73 kg)   SpO2 95%   BMI 31.44 kg/m   Physical Exam Constitutional:      General:  She is not in acute distress.    Appearance: Normal appearance. She is well-developed. She is not ill-appearing, toxic-appearing or diaphoretic.  HENT:     Head: Normocephalic and atraumatic.     Nose: Nose normal.     Mouth/Throat:     Mouth: Mucous membranes are moist.  Eyes:     General: No scleral icterus.       Right eye: No discharge.        Left eye: No discharge.     Extraocular Movements: Extraocular movements intact.  Cardiovascular:     Rate and Rhythm: Normal rate.  Pulmonary:     Effort: Pulmonary effort is normal.  Skin:    General: Skin is warm and dry.  Neurological:     General: No focal deficit present.     Mental Status: She is alert and oriented to person, place, and time.  Psychiatric:        Mood and Affect: Mood normal.        Behavior: Behavior normal.    Results for orders placed or performed during the  hospital encounter of 05/31/24 (from the past 24 hours)  POCT URINE DIPSTICK     Status: Abnormal   Collection Time: 05/31/24  3:45 PM  Result Value Ref Range   Color, UA yellow yellow   Clarity, UA hazy (A) clear   Glucose, UA negative negative mg/dL   Bilirubin, UA negative negative   Ketones, POC UA trace (5) (A) negative mg/dL   Spec Grav, UA 8.984 8.989 - 1.025   Blood, UA small (A) negative   pH, UA 6.0 5.0 - 8.0   POC PROTEIN,UA negative negative, trace   Urobilinogen, UA 0.2 0.2 or 1.0 E.U./dL   Nitrite, UA Negative Negative   Leukocytes, UA Moderate (2+) (A) Negative    Assessment and Plan :   PDMP not reviewed this encounter.  1. Acute cystitis with hematuria   2. Urinary frequency    Start cephalexin  to cover for acute cystitis, urine culture pending.  Recommended consistent hydration, limiting urinary irritants. Counseled patient on potential for adverse effects with medications prescribed/recommended today, ER and return-to-clinic precautions discussed, patient verbalized understanding.     [1]  Allergies Allergen Reactions   Chlorhexidine Rash   Sulfa Antibiotics Other (See Comments)    Burning sensation      Christopher Savannah, PA-C 05/31/24 1559  "

## 2024-05-31 NOTE — ED Triage Notes (Signed)
 Pt states that she has some pain with urination. Pt states that she has odor of her urine, low back pain, abdominal pain and fever. X3 days  Pt states that she has a history of uti's.

## 2024-06-02 ENCOUNTER — Ambulatory Visit (HOSPITAL_COMMUNITY): Payer: Self-pay

## 2024-06-02 LAB — URINE CULTURE: Culture: >=100000 — AB

## 2024-10-27 ENCOUNTER — Ambulatory Visit: Admitting: Urology
